# Patient Record
Sex: Male | Born: 1985 | Race: Black or African American | Hispanic: No | Marital: Single | Smoking: Former smoker
Health system: Southern US, Community
[De-identification: ages and names within clinical notes are randomized; demographics above are authoritative.]

## PROBLEM LIST (undated history)

## (undated) DIAGNOSIS — M109 Gout, unspecified: Secondary | ICD-10-CM

## (undated) DIAGNOSIS — I509 Heart failure, unspecified: Secondary | ICD-10-CM

## (undated) DIAGNOSIS — Z9289 Personal history of other medical treatment: Secondary | ICD-10-CM

## (undated) HISTORY — PX: TONSILLECTOMY: SUR1361

## (undated) HISTORY — DX: Heart failure, unspecified: I50.9

---

## 2005-10-08 ENCOUNTER — Other Ambulatory Visit: Payer: Self-pay

## 2005-10-08 ENCOUNTER — Emergency Department: Payer: Self-pay | Admitting: Unknown Physician Specialty

## 2010-12-09 ENCOUNTER — Emergency Department: Payer: Self-pay | Admitting: Emergency Medicine

## 2012-07-30 ENCOUNTER — Emergency Department: Payer: Self-pay | Admitting: Emergency Medicine

## 2012-12-19 ENCOUNTER — Emergency Department: Payer: Self-pay | Admitting: Emergency Medicine

## 2015-04-14 ENCOUNTER — Encounter: Payer: Self-pay | Admitting: Emergency Medicine

## 2015-04-14 ENCOUNTER — Emergency Department: Payer: BLUE CROSS/BLUE SHIELD

## 2015-04-14 ENCOUNTER — Inpatient Hospital Stay
Admission: EM | Admit: 2015-04-14 | Discharge: 2015-04-17 | DRG: 189 | Disposition: A | Discharge status: Home / Self Care | Payer: BLUE CROSS/BLUE SHIELD | Attending: Internal Medicine | Admitting: Internal Medicine

## 2015-04-14 DIAGNOSIS — R0602 Shortness of breath: Secondary | ICD-10-CM

## 2015-04-14 DIAGNOSIS — I509 Heart failure, unspecified: Secondary | ICD-10-CM

## 2015-04-14 DIAGNOSIS — M7989 Other specified soft tissue disorders: Secondary | ICD-10-CM | POA: Diagnosis present

## 2015-04-14 DIAGNOSIS — J9601 Acute respiratory failure with hypoxia: Secondary | ICD-10-CM | POA: Diagnosis present

## 2015-04-14 DIAGNOSIS — F17210 Nicotine dependence, cigarettes, uncomplicated: Secondary | ICD-10-CM | POA: Diagnosis present

## 2015-04-14 DIAGNOSIS — Z6841 Body Mass Index (BMI) 40.0 and over, adult: Secondary | ICD-10-CM | POA: Diagnosis not present

## 2015-04-14 DIAGNOSIS — M1000 Idiopathic gout, unspecified site: Secondary | ICD-10-CM | POA: Diagnosis present

## 2015-04-14 DIAGNOSIS — G4733 Obstructive sleep apnea (adult) (pediatric): Secondary | ICD-10-CM | POA: Diagnosis present

## 2015-04-14 DIAGNOSIS — R0902 Hypoxemia: Secondary | ICD-10-CM | POA: Diagnosis present

## 2015-04-14 DIAGNOSIS — G4730 Sleep apnea, unspecified: Secondary | ICD-10-CM | POA: Diagnosis present

## 2015-04-14 DIAGNOSIS — I5030 Unspecified diastolic (congestive) heart failure: Secondary | ICD-10-CM | POA: Diagnosis present

## 2015-04-14 DIAGNOSIS — Z791 Long term (current) use of non-steroidal anti-inflammatories (NSAID): Secondary | ICD-10-CM | POA: Diagnosis not present

## 2015-04-14 DIAGNOSIS — M25572 Pain in left ankle and joints of left foot: Secondary | ICD-10-CM | POA: Diagnosis present

## 2015-04-14 DIAGNOSIS — I5021 Acute systolic (congestive) heart failure: Secondary | ICD-10-CM | POA: Diagnosis not present

## 2015-04-14 HISTORY — DX: Personal history of other medical treatment: Z92.89

## 2015-04-14 HISTORY — DX: Morbid (severe) obesity due to excess calories: E66.01

## 2015-04-14 HISTORY — DX: Gout, unspecified: M10.9

## 2015-04-14 LAB — BLOOD GAS, ARTERIAL
ALLENS TEST (PASS/FAIL): POSITIVE — AB
Acid-Base Excess: 8.3 mmol/L — ABNORMAL HIGH (ref 0.0–3.0)
Bicarbonate: 36.9 mEq/L — ABNORMAL HIGH (ref 21.0–28.0)
FIO2: 0.36 %
O2 SAT: 93.3 %
PCO2 ART: 70 mmHg — AB (ref 32.0–48.0)
PH ART: 7.33 — AB (ref 7.350–7.450)
PO2 ART: 73 mmHg — AB (ref 83.0–108.0)
Patient temperature: 37

## 2015-04-14 LAB — FIBRIN DERIVATIVES D-DIMER (ARMC ONLY): FIBRIN DERIVATIVES D-DIMER (ARMC): 1142.03 — AB (ref 0–499)

## 2015-04-14 LAB — CBC
HCT: 44.1 % (ref 40.0–52.0)
HEMOGLOBIN: 13.9 g/dL (ref 13.0–18.0)
MCH: 24.8 pg — ABNORMAL LOW (ref 26.0–34.0)
MCHC: 31.5 g/dL — ABNORMAL LOW (ref 32.0–36.0)
MCV: 78.6 fL — ABNORMAL LOW (ref 80.0–100.0)
PLATELETS: 210 10*3/uL (ref 150–440)
RBC: 5.6 MIL/uL (ref 4.40–5.90)
RDW: 17.7 % — ABNORMAL HIGH (ref 11.5–14.5)
WBC: 10.7 10*3/uL — ABNORMAL HIGH (ref 3.8–10.6)

## 2015-04-14 LAB — BASIC METABOLIC PANEL
Anion gap: 9 (ref 5–15)
BUN: 17 mg/dL (ref 6–20)
CALCIUM: 8.3 mg/dL — AB (ref 8.9–10.3)
CHLORIDE: 97 mmol/L — AB (ref 101–111)
CO2: 35 mmol/L — ABNORMAL HIGH (ref 22–32)
CREATININE: 0.98 mg/dL (ref 0.61–1.24)
GFR calc Af Amer: >60 mL/min (ref >60)
GLUCOSE: 75 mg/dL (ref 65–99)
POTASSIUM: 4 mmol/L (ref 3.5–5.1)
Sodium: 141 mmol/L (ref 135–145)

## 2015-04-14 LAB — TROPONIN I: TROPONIN I: 0.03 ng/mL (ref <0.031)

## 2015-04-14 MED ORDER — FUROSEMIDE 10 MG/ML IJ SOLN
20.0000 mg | Freq: Two times a day (BID) | INTRAMUSCULAR | Status: DC
Start: 2015-04-14 — End: 2015-04-16
  Administered 2015-04-15 – 2015-04-16 (×3): 20 mg via INTRAVENOUS
  Filled 2015-04-14 (×3): qty 2

## 2015-04-14 MED ORDER — DOCUSATE SODIUM 100 MG PO CAPS: 100.0000 mg | ORAL_CAPSULE | Freq: Two times a day (BID) | ORAL | Status: DC | PRN

## 2015-04-14 MED ORDER — PREDNISONE 20 MG PO TABS
50.0000 mg | ORAL_TABLET | Freq: Every day | ORAL | Status: DC
  Administered 2015-04-15 – 2015-04-17 (×3): 50 mg via ORAL
  Filled 2015-04-14 (×3): qty 2

## 2015-04-14 MED ORDER — SODIUM CHLORIDE 0.9 % IJ SOLN
3.0000 mL | Freq: Two times a day (BID) | INTRAMUSCULAR | Status: DC
  Administered 2015-04-14 – 2015-04-16 (×5): 3 mL via INTRAVENOUS

## 2015-04-14 MED ORDER — FUROSEMIDE 10 MG/ML IJ SOLN
20.0000 mg | Freq: Once | INTRAMUSCULAR | Status: AC
  Administered 2015-04-14: 20 mg via INTRAVENOUS

## 2015-04-14 MED ORDER — SENNA 8.6 MG PO TABS: 1.0000 | ORAL_TABLET | Freq: Every day | ORAL | Status: DC | PRN

## 2015-04-14 MED ORDER — ALBUTEROL SULFATE (2.5 MG/3ML) 0.083% IN NEBU: 2.5000 mg | INHALATION_SOLUTION | Freq: Once | RESPIRATORY_TRACT | Status: AC

## 2015-04-14 MED ORDER — IPRATROPIUM-ALBUTEROL 0.5-2.5 (3) MG/3ML IN SOLN
RESPIRATORY_TRACT | Status: AC
  Administered 2015-04-14: 3 mL
  Filled 2015-04-14: qty 3

## 2015-04-14 MED ORDER — ALBUTEROL SULFATE (2.5 MG/3ML) 0.083% IN NEBU: 2.5000 mg | INHALATION_SOLUTION | RESPIRATORY_TRACT | Status: DC | PRN

## 2015-04-14 MED ORDER — FUROSEMIDE 10 MG/ML IJ SOLN
INTRAMUSCULAR | Status: AC
  Administered 2015-04-14: 20 mg via INTRAVENOUS
  Filled 2015-04-14: qty 4

## 2015-04-14 MED ORDER — ACETAMINOPHEN 325 MG PO TABS: 650.0000 mg | ORAL_TABLET | Freq: Four times a day (QID) | ORAL | Status: DC | PRN

## 2015-04-14 MED ORDER — HEPARIN SODIUM (PORCINE) 5000 UNIT/ML IJ SOLN
5000.0000 [IU] | Freq: Three times a day (TID) | INTRAMUSCULAR | Status: DC
  Administered 2015-04-14 – 2015-04-16 (×5): 5000 [IU] via SUBCUTANEOUS
  Filled 2015-04-14 (×5): qty 1

## 2015-04-14 NOTE — ED Provider Notes (Signed)
[facility] Emergency Department Provider Note  ____________________________________________  Time seen: Approximately 1:10 PM  I have reviewed the triage vital signs and the nursing notes.   HISTORY  Chief Complaint Leg Swelling    HPI Theodore Reid is a 29 y.o. male with a history of morbid obesity and gout who presents today with several months of worsening shortness of breath. Patient denies any chest pain. Denies any shortness of breath at rest. Says that his shortness of breath only occurs when he is up and walking. No history of DVT. Does think he gained weight over the past several months, but does not weigh self. Says that he does wheeze intermittently. No pain with deep breathing. Went to urgent care this morning for evaluation of left ankle pain which she says feels like his gout. He has had gout in his left ankle in the past. The ankle pain only started today and only hurts when ambulating. It is the left medial ankle. He does report swelling of the bilateral legs over the past year. No recent increase in swelling of the legs bilaterally. Denies any recent fever. The pain in the left ankle is sharp when bearing weight but not painful at all while laying.He is not on home O2.   Past Medical History  Diagnosis Date  . Obese     There are no active problems to display for this patient.   History reviewed. No pertinent past surgical history.  Current Outpatient Rx  Name  Route  Sig  Dispense  Refill  . acetaminophen (TYLENOL) 325 MG tablet   Oral   Take 650 mg by mouth every 6 (six) hours as needed for headache.         . ibuprofen (ADVIL,MOTRIN) 200 MG tablet   Oral   Take 200 mg by mouth every 6 (six) hours as needed for headache or mild pain.           Allergies Review of patient's allergies indicates not on file.  No family history on file.  Social History History  Substance Use Topics  . Smoking status: Former Games developermoker  .  Smokeless tobacco: Not on file  . Alcohol Use: No    Review of Systems Constitutional: No fever/chills Eyes: No visual changes. ENT: No sore throat. Cardiovascular: Denies chest pain. Respiratory: Shortness of breath.  Gastrointestinal: No abdominal pain.  No nausea, no vomiting.  No diarrhea.  No constipation. Genitourinary: Negative for dysuria. Musculoskeletal: Negative for back pain. Skin: Negative for rash. Neurological: Negative for headaches, focal weakness or numbness.  10-point ROS otherwise negative.  ____________________________________________   PHYSICAL EXAM:  VITAL SIGNS: ED Triage Vitals  Enc Vitals Group     BP 04/14/15 1223 163/99 mmHg     Pulse Rate 04/14/15 1223 86     Resp 04/14/15 1223 18     Temp 04/14/15 1223 97.7 F (36.5 C)     Temp src --      SpO2 04/14/15 1223 84 %     Weight 04/14/15 1233 575 lb (260.818 kg)     Height 04/14/15 1223 5\' 11"  (1.803 m)     Head Cir --      Peak Flow --      Pain Score 04/14/15 1234 4     Pain Loc --      Pain Edu? --      Excl. in GC? --     Constitutional: Alert and oriented. Morbidly obese. Wearing nasal cannula O2.  Eyes: Conjunctivae are normal. PERRL. EOMI. Head: Atraumatic. Nose: No congestion/rhinnorhea. Mouth/Throat: Mucous membranes are moist.  Oropharynx non-erythematous. Neck: No stridor.   Cardiovascular: Normal rate, regular rhythm. Grossly normal heart sounds.  Good peripheral circulation. Respiratory: Normal respiratory effort.  No retractions. Lungs CTAB. Gastrointestinal: Soft and nontender. No distention. No abdominal bruits. No CVA tenderness. Musculoskeletal: Bilateral lower extremity edema with hyperkeratinized skin likely consistent with chronic stasis changes.  No joint effusions. No tenderness over the left medial ankle. There is no redness, erythema or warmth. Neurologic:  Normal speech and language. No gross focal neurologic deficits are appreciated. Speech is normal. No gait  instability. Skin:  Skin is warm, dry and intact. No rash noted. Psychiatric: Mood and affect are normal. Speech and behavior are normal.  ____________________________________________   LABS (all labs ordered are listed, but only abnormal results are displayed)  Labs Reviewed  CBC - Abnormal; Notable for the following:    WBC 10.7 (*)    MCV 78.6 (*)    MCH 24.8 (*)    MCHC 31.5 (*)    RDW 17.7 (*)    All other components within normal limits  BASIC METABOLIC PANEL - Abnormal; Notable for the following:    Chloride 97 (*)    CO2 35 (*)    Calcium 8.3 (*)    All other components within normal limits  FIBRIN DERIVATIVES D-DIMER [facility]) - Abnormal; Notable for the following:    Fibrin derivatives D-dimer [facility]) 1142.03 (*)    All other components within normal limits  TROPONIN I   ____________________________________________  EKG  ED ECG REPORT   Date: 04/14/2015  EKG Time: 1231  Rate: 85  Rhythm: normal EKG, normal sinus rhythm, unchanged from previous tracings, normal sinus rhythm  Axis: Indeterminate axis  Intervals:Prolonged QT  ST&T Change: No ST elevations or depressions. No abnormal T-wave inversions. Pulmonary disease pattern read by EKG machine. No S1 every 3 T3 pattern because there is no Q wave in lead 3.  ____________________________________________  RADIOLOGY  Chest x-ray with cardiomegaly and pulmonary vascular congestion. No acute bony pathology on ankle x-ray. Soft tissue swelling noted. ____________________________________________    ____________________________________________   INITIAL IMPRESSION / ASSESSMENT AND PLAN / ED COURSE  Pertinent labs & imaging results that were available during my care of the patient were reviewed by me and considered in my medical decision making (see chart for details).  Patient with risk factors for pickwickian syndrome as well as PE. Patient is not hypotensive or tachycardic and does not have any pleuritic  chest pain. Less likely PE. However, the patient does not have wheezing. We'll try with nebulizer treatment. Will await d-dimer for further evaluation of clot.   ----------------------------------------- 3:25 PM on 04/14/2015 -----------------------------------------  Patient's symptoms likely obesity hypoventilation (has elevated bicarbonate on BMP.) Disease and heart failure. Elevated d-dimer. Patient pending VQ scan. Weight exceeds weight limit of our CAT scan here in the emergency department. Patient will be maintained on nasal cannula oxygen. Does not wear CPAP at home. Will be admitted to the hospital. ____________________________________________   FINAL CLINICAL IMPRESSION(S) / ED DIAGNOSES  Hypoxia. Heart failure. Acute, initial visit.    Myrna Blazeravid Matthew Schaevitz, MD 04/14/15 [phone]

## 2015-04-14 NOTE — ED Notes (Signed)
Pt reports that he has had swelling and redness in both legs for the last year, but just recently become more SOB. He went to the Urgent care and they sent him here for evaluated. Pt is SOB and quit smoking yesterday. Pts sats in the 80's. Can speak in a few word sentences.

## 2015-04-14 NOTE — ED Notes (Signed)
Dr. Pershing ProudSchaevitz notified of CO2 of 70 from ABG

## 2015-04-14 NOTE — Progress Notes (Signed)
Patient admitted to unit. Oriented to room, call bell, and staff. Bed in lowest position. Fall safety plan reviewed. Moderate fall risk - bed alarm not indicated.  Full assessment to Epic. Will continue to monitor. Seen by respiratory therapist. O2 sats at 91% on 4L so patient can eat dinner, than to be checked by RT and placed on Bipap. No pain at this time.  Adella NissenBailey, Timofey Carandang G

## 2015-04-14 NOTE — ED Notes (Signed)
Patient awake and alert eating a small snack. Mother at bedside. Patient able to stay awake on his own. O2 at 4L.  Respiratory running ABG and will set up Bipap.

## 2015-04-14 NOTE — H&P (Signed)
[facility] [place] [facility]   PATIENT NAME: Theodore Reid    MR#:  [id number]  DATE OF BIRTH:  12/21/1985  DATE OF ADMISSION:  04/14/2015  PRIMARY CARE PHYSICIAN: No primary care provider on file.   REQUESTING/REFERRING PHYSICIAN: None  CHIEF COMPLAINT:   Chief Complaint  Patient presents with  . Leg Swelling    HISTORY OF PRESENT ILLNESS:  Theodore Reid  is a 29 y.o. male with a known history of obesity presents to the urgent care secondary to left ankle pain. 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 When he went to urgent care for possible gout attack, they have noted that patient is hypoxic saturations and 86% on room air. So the patient was sent over to the emergency room. No known history of sleep apnea or COPD. Patient is morbidly obese and likely has underlying sleep apnea. He has snoring episodes at nighttime, daytime sleepiness. Chest x-ray here revealed possible pulmonary vascular congestion. So patient is being admitted for hypoxia likely secondary to congestive heart failure and likely has underlying sleep apnea.  PAST MEDICAL HISTORY:   Past Medical History  Diagnosis Date  . Obese     PAST SURGICAL HISTORY:   Past Surgical History  Procedure Laterality Date  . Tonsillectomy      SOCIAL HISTORY:   History  Substance Use Topics  . Smoking status: Current Every Day Smoker -- 1.00 packs/day    Types: Cigarettes  . Smokeless tobacco: Not on file  . Alcohol Use: No    FAMILY HISTORY:   Family History  Problem Relation Age of Onset  . Diabetes Mother   . CAD Mother   . Hypertension Mother   . Diabetes Father   . Cancer Father     DRUG ALLERGIES:  Not on File No known drug allergies REVIEW OF SYSTEMS:   Review of Systems  Constitutional: Negative for fever, chills, weight loss and malaise/fatigue.  HENT: Negative for ear discharge, ear pain, hearing loss, nosebleeds and  tinnitus.   Eyes: Negative for blurred vision, double vision and photophobia.       Uses reading glasses  Respiratory: Positive for shortness of breath. Negative for cough, hemoptysis and wheezing.   Cardiovascular: Positive for leg swelling. Negative for chest pain, palpitations and orthopnea.  Gastrointestinal: Negative for heartburn, nausea, vomiting, abdominal pain, diarrhea, constipation and melena.  Genitourinary: Negative for dysuria, urgency, frequency and hematuria.  Musculoskeletal: Positive for joint pain. Negative for myalgias, back pain and neck pain.  Skin: Negative for rash.  Neurological: Negative for dizziness, tingling, tremors, sensory change, speech change, focal weakness and headaches.  Endo/Heme/Allergies: Does not bruise/bleed easily.  Psychiatric/Behavioral: Negative for depression.    MEDICATIONS AT HOME:   Prior to Admission medications   Medication Sig Start Date End Date Taking? Authorizing Provider  acetaminophen (TYLENOL) 325 MG tablet Take 650 mg by mouth every 6 (six) hours as needed for headache.   Yes Historical Provider, MD  ibuprofen (ADVIL,MOTRIN) 200 MG tablet Take 200 mg by mouth every 6 (six) hours as needed for headache or mild pain.   Yes Historical Provider, MD      VITAL SIGNS:  Blood pressure 161/69, pulse 90, temperature 97.7 F (36.5 C), resp. rate 29, height 5\' 10"  (1.778 m), weight 260.818 kg (575 lb), SpO2 93 %.  PHYSICAL EXAMINATION:   Physical Exam  GENERAL:  29 y.o.-year-old morbidly obese patient lying in the bed with no acute  distress.  EYES: Pupils equal, round, reactive to light and accommodation. No scleral icterus. Extraocular muscles intact.  HEENT: Head atraumatic, normocephalic. Oropharynx and nasopharynx clear.  NECK:  Supple, no jugular venous distention. No thyroid enlargement, no tenderness.  LUNGS: Normal breath sounds bilaterally, decreased bibasilar breath sounds. no wheezing, rales,rhonchi or crepitation. No use  of accessory muscles of respiration.  CARDIOVASCULAR: S1, S2 normal. No murmurs, rubs, or gallops.  ABDOMEN: Soft, nontender, nondistended. Bowel sounds present. No organomegaly or mass.  EXTREMITIES: 2+ pedal edema, cyanosis, or clubbing.  NEUROLOGIC: Cranial nerves II through XII are intact. Muscle strength 5/5 in all extremities. Sensation intact. Gait not checked.  PSYCHIATRIC: The patient is alert and oriented x 3.  SKIN: No obvious rash, lesion, or ulcer.   LABORATORY PANEL:   CBC  Recent Labs Lab 04/14/15 1357  WBC 10.7*  HGB 13.9  HCT 44.1  PLT 210   ------------------------------------------------------------------------------------------------------------------  Chemistries   Recent Labs Lab 04/14/15 1357  NA 141  K 4.0  CL 97*  CO2 35*  GLUCOSE 75  BUN 17  CREATININE 0.98  CALCIUM 8.3*   ------------------------------------------------------------------------------------------------------------------  Cardiac Enzymes  Recent Labs Lab 04/14/15 1357  TROPONINI 0.03   ------------------------------------------------------------------------------------------------------------------  RADIOLOGY:  Dg Chest 2 View  04/14/2015   CLINICAL DATA:  29 year old male with intermittent shortness of breath  EXAM: CHEST  2 VIEW  COMPARISON:  Prior chest x-ray 12/19/2012  FINDINGS: Borderline cardiomegaly with pulmonary vascular congestion but no overt edema. No pleural effusion or pneumothorax. Radiographs are limited by body habitus. No acute osseous abnormality.  IMPRESSION: 1. Interval development of cardiomegaly and pulmonary vascular congestion bordering on mild interstitial edema. 2. Radiographs are slightly limited by patient body habitus.   Electronically Signed   By: Malachy MoanHeath  McCullough M.D.   On: 04/14/2015 14:32   Dg Ankle Complete Left  04/14/2015   CLINICAL DATA:  Left ankle pain  EXAM: LEFT ANKLE COMPLETE - 3+ VIEW  COMPARISON:  None.  FINDINGS: No acute  fracture. No dislocation. Minimal spurring at the posterior calcaneus. Diffuse soft tissue swelling about the ankle joint.  IMPRESSION: No acute bony pathology. Soft tissue swelling about the ankle is noted.   Electronically Signed   By: Jolaine ClickArthur  Hoss M.D.   On: 04/14/2015 14:33    EKG:  No orders found for this or any previous visit.  IMPRESSION AND PLAN:   Theodore Reid  is a 29 y.o. male with a known history of obesity presents to the urgent care secondary to left ankle pain and hypoxia.  #1 acute hypoxic respiratory failure-likely second to pulmonary vascular congestion. Likely has right heart strain from sleep apnea. We'll admit continue oxygen support. Currently on 3 L oxygen. IV Lasix twice a day. Echocardiogram. Try CPAP here at bedtime. Continue to wean oxygen as tolerated. D-dimer is elevated. Cannot undergo a CT chest due to his weight. A VQ scan has been ordered.  #2 acute gout of left ankle-x-rays negative. Started on prednisone x 5 days.  #3 DVT prophylaxis-subcutaneous heparin.    All the records are reviewed and case discussed with ED provider. Management plans discussed with the patient, family and they are in agreement.  CODE STATUS: Full code  TOTAL TIME TAKING CARE OF THIS PATIENT: 50 minutes.    Enid BaasKALISETTI,Syanna Remmert M.D on 04/14/2015 at 3:42 PM  Between 7am to 6pm - Pager - [phone]  After 6pm go to [email or web address] - password [email or web address] [facility]  [facility]  Office  [phone]  CC:  Primary care physician; No primary care provider on file.

## 2015-04-14 NOTE — ED Notes (Signed)
Patient came from [facility]. Was given IM shot of Toradol 60mg  and prescriptions for Indomethacin and Norco also.  Did not fill either one since they came straight from urgent care. C/o leg pain on the left side near ankle/foot.

## 2015-04-14 NOTE — ED Notes (Signed)
Patient in xray still.

## 2015-04-14 NOTE — ED Notes (Signed)
Notified that patient's sats on 4L were 48%, patient asleep on stretcher and somewhat more difficult than normal to arouse.  Patient finally able to arouse, oxygen sats coming up as patient awakens into the 80s.  Dr. Pershing ProudSchaevitz aware as well as attending, Bipap ordered and ABG ordered.  Respiratory notified.

## 2015-04-14 NOTE — ED Notes (Signed)
Dr. Pershing ProudSchaevitz notified of patient not able to do lung scan here due to his size.

## 2015-04-14 NOTE — ED Notes (Signed)
Patient oxygen sats 74% on 4L, Patient is asleep and snoring and has obvious periods of apnea that are observed by myself and the patient's mother.  Patient is easily arousable. Dr. Pershing ProudSchaevitz notified of results.

## 2015-04-14 NOTE — ED Notes (Signed)
Patient transported to X-Ray 

## 2015-04-15 ENCOUNTER — Inpatient Hospital Stay (HOSPITAL_COMMUNITY)
Admit: 2015-04-15 | Discharge: 2015-04-15 | Disposition: A | Discharge status: Home / Self Care | Payer: BLUE CROSS/BLUE SHIELD | Attending: Internal Medicine | Admitting: Internal Medicine

## 2015-04-15 DIAGNOSIS — I5021 Acute systolic (congestive) heart failure: Secondary | ICD-10-CM

## 2015-04-15 LAB — BASIC METABOLIC PANEL
ANION GAP: 8 (ref 5–15)
BUN: 19 mg/dL (ref 6–20)
CO2: 33 mmol/L — ABNORMAL HIGH (ref 22–32)
CREATININE: 0.91 mg/dL (ref 0.61–1.24)
Calcium: 8.6 mg/dL — ABNORMAL LOW (ref 8.9–10.3)
Chloride: 99 mmol/L — ABNORMAL LOW (ref 101–111)
GFR calc Af Amer: >60 mL/min (ref >60)
GFR calc non Af Amer: >60 mL/min (ref >60)
GLUCOSE: 108 mg/dL — AB (ref 65–99)
Potassium: 4.5 mmol/L (ref 3.5–5.1)
Sodium: 140 mmol/L (ref 135–145)

## 2015-04-15 LAB — CBC
HEMATOCRIT: 46.9 % (ref 40.0–52.0)
HEMOGLOBIN: 14.7 g/dL (ref 13.0–18.0)
MCH: 25 pg — ABNORMAL LOW (ref 26.0–34.0)
MCHC: 31.4 g/dL — AB (ref 32.0–36.0)
MCV: 79.7 fL — AB (ref 80.0–100.0)
Platelets: 226 10*3/uL (ref 150–440)
RBC: 5.89 MIL/uL (ref 4.40–5.90)
RDW: 18 % — ABNORMAL HIGH (ref 11.5–14.5)
WBC: 8.9 10*3/uL (ref 3.8–10.6)

## 2015-04-15 NOTE — Progress Notes (Signed)
Dr. Amado CoeGouru spoke to family on phone for update, per family/patient request. MD to order pulmonary consult. No patient complaints at this time.  Adella NissenBailey, Priya Matsen G

## 2015-04-15 NOTE — Progress Notes (Signed)
PT Cancellation Note  Patient Details Name: Theodore NewtonMichael J Reid MRN: [id number] DOB: 11-24-1986   Cancelled Treatment:    Reason Eval/Treat Not Completed: Other (comment). Chart reviewed, RN consulted. Holding pt treatment at this time due to elevated D-Dimer and ordered VQ scan that was not able to take place. PT would like to speak to physician to get clearance to work with PT in absence of VQ,or if evaluation should be held until 48hrs s/p commencement of anticoagulation therapy. RN is in accordance. Will attempt at later date/time.     Buccola,Allan C 04/15/2015, 2:01 PM  Rosamaria LintsAllan C Buccola, PT, DPT, BM

## 2015-04-15 NOTE — Progress Notes (Signed)
Patient was on acute 4L of supplemental O2 overnight. He denied any pain and was NSR on the monitor. Patient was educated about subQ heparin.

## 2015-04-15 NOTE — Progress Notes (Signed)
[facility] [place] [facility]   PATIENT NAME: Theodore JacksMichael Reid    MR#:  [id number]  DATE OF BIRTH:  [birth year]  SUBJECTIVE:  CHIEF COMPLAINT:  Shortness of breath Today patient is lethargic but opens eyes to verbal commands and falling asleep again still on BiPAP Reports that his shortness of breath is better  REVIEW OF SYSTEMS:  CONSTITUTIONAL: No fever, fatigue or weakness.  EYES: No blurred or double vision.  EARS, NOSE, AND THROAT: No tinnitus or ear pain.  RESPIRATORY: No cough, shortness of breath with exertion, no wheezing or hemoptysis.  CARDIOVASCULAR: No chest pain, orthopnea, edema.  GASTROINTESTINAL: No nausea, vomiting, diarrhea or abdominal pain.  GENITOURINARY: No dysuria, hematuria.  ENDOCRINE: No polyuria, nocturia,  HEMATOLOGY: No anemia, easy bruising or bleeding SKIN: No rash or lesion. MUSCULOSKELETAL: No joint pain or arthritis.   NEUROLOGIC: No tingling, numbness, weakness.  PSYCHIATRY: No anxiety or depression.   DRUG ALLERGIES:  Not on File  VITALS:  Blood pressure 132/62, pulse 72, temperature 98.1 F (36.7 C), temperature source Axillary, resp. rate 18, height 5\' 10"  (1.778 m), weight 201.352 kg (443 lb 14.4 oz), SpO2 96 %.  PHYSICAL EXAMINATION:  GENERAL:  29 y.o.-year-old patient lying in the bed with no acute distress. Morbidly obese EYES: Pupils equal, round, reactive to light and accommodation. No scleral icterus. Extraocular muscles intact.  HEENT: Head atraumatic, normocephalic. Oropharynx and nasopharynx clear.  NECK:  Supple, no jugular venous distention. No thyroid enlargement, no tenderness.  LUNGS: Normal breath sounds bilaterally, no wheezing, some rales,rhonchi or crepitation. No use of accessory muscles of respiration.  CARDIOVASCULAR: S1, S2 normal. No murmurs, rubs, or gallops.  ABDOMEN: Soft, nontender, nondistended. Bowel sounds present. No organomegaly or mass.  EXTREMITIES: No pedal edema, cyanosis,  or clubbing.  NEUROLOGIC: Cranial nerves II through XII are intact. Muscle strength 5/5 in all extremities. Sensation intact. Gait not checked.  PSYCHIATRIC: The patient is alert and oriented x 3.  SKIN: No obvious rash, lesion, or ulcer.    LABORATORY PANEL:   CBC  Recent Labs Lab 04/15/15 0429  WBC 8.9  HGB 14.7  HCT 46.9  PLT 226   ------------------------------------------------------------------------------------------------------------------  Chemistries   Recent Labs Lab 04/15/15 0429  NA 140  K 4.5  CL 99*  CO2 33*  GLUCOSE 108*  BUN 19  CREATININE 0.91  CALCIUM 8.6*   ------------------------------------------------------------------------------------------------------------------  Cardiac Enzymes  Recent Labs Lab 04/14/15 1357  TROPONINI 0.03   ------------------------------------------------------------------------------------------------------------------  RADIOLOGY:  Dg Chest 2 View  04/14/2015   CLINICAL DATA:  29 year old male with intermittent shortness of breath  EXAM: CHEST  2 VIEW  COMPARISON:  Prior chest x-ray 12/19/2012  FINDINGS: Borderline cardiomegaly with pulmonary vascular congestion but no overt edema. No pleural effusion or pneumothorax. Radiographs are limited by body habitus. No acute osseous abnormality.  IMPRESSION: 1. Interval development of cardiomegaly and pulmonary vascular congestion bordering on mild interstitial edema. 2. Radiographs are slightly limited by patient body habitus.   Electronically Signed   By: Malachy MoanHeath  McCullough M.D.   On: 04/14/2015 14:32   Dg Ankle Complete Left  04/14/2015   CLINICAL DATA:  Left ankle pain  EXAM: LEFT ANKLE COMPLETE - 3+ VIEW  COMPARISON:  None.  FINDINGS: No acute fracture. No dislocation. Minimal spurring at the posterior calcaneus. Diffuse soft tissue swelling about the ankle joint.  IMPRESSION: No acute bony pathology. Soft tissue swelling about the ankle is noted.   Electronically Signed    By: Merton BorderArthur  Hoss M.D.   On: 04/14/2015 14:33    EKG:   Orders placed or performed during the hospital encounter of 04/14/15  . EKG 12-Lead  . EKG 12-Lead    ASSESSMENT AND PLAN:   *1 acute hypoxic respiratory failure-likely second to pulmonary vascular congestion. Likely has right heart strain from sleep apnea . Weaned off BiPAP, Currently on 3 L oxygen. IV Lasix twice a day. Echocardiogram. Try CPAP here at bedtime. Continue to wean oxygen as tolerated. Pulmonology consult is placed, needs outpatient pulmonary function tests and sleep study D-dimer is elevated. Cannot undergo a CT chest due to his weight. Can't get VQ scan either because of his body habitus . Clinically better.  * acute gout of left ankle-x-rays negative. Started on prednisone x 5 days.  *Morbid obesity-patient will be benefited with bariatric surgery  * DVT prophylaxis-subcutaneous heparin     All the records are reviewed and case discussed with Care Management/Social Workerr. Management plans discussed with the patient, family and they are in agreement.  CODE STATUS: full  TOTAL TIME TAKING CARE OF THIS PATIENT: 35 minutes.   POSSIBLE D/C IN 2-3 DAYS, DEPENDING ON CLINICAL CONDITION.   [name], Kirstan Fentress M.D on 04/15/2015 at 2:32 PM  Between 7am to 6pm - Pager - [phone] After 6pm go to [email or web address] - password [email or web address] [facility]  [facility]  Office  [phone]  CC: Primary care physician; No primary care provider on file.

## 2015-04-16 ENCOUNTER — Encounter: Payer: Self-pay | Admitting: Physician Assistant

## 2015-04-16 ENCOUNTER — Inpatient Hospital Stay: Payer: BLUE CROSS/BLUE SHIELD

## 2015-04-16 LAB — BASIC METABOLIC PANEL
Anion gap: 7 (ref 5–15)
BUN: 19 mg/dL (ref 6–20)
CO2: 36 mmol/L — ABNORMAL HIGH (ref 22–32)
CREATININE: 0.9 mg/dL (ref 0.61–1.24)
Calcium: 8.4 mg/dL — ABNORMAL LOW (ref 8.9–10.3)
Chloride: 96 mmol/L — ABNORMAL LOW (ref 101–111)
GFR calc Af Amer: >60 mL/min (ref >60)
Glucose, Bld: 98 mg/dL (ref 65–99)
POTASSIUM: 3.9 mmol/L (ref 3.5–5.1)
Sodium: 139 mmol/L (ref 135–145)

## 2015-04-16 LAB — CBC
HCT: 47.6 % (ref 40.0–52.0)
Hemoglobin: 14.8 g/dL (ref 13.0–18.0)
MCH: 24.7 pg — AB (ref 26.0–34.0)
MCHC: 31 g/dL — ABNORMAL LOW (ref 32.0–36.0)
MCV: 79.6 fL — ABNORMAL LOW (ref 80.0–100.0)
Platelets: 224 10*3/uL (ref 150–440)
RBC: 5.98 MIL/uL — ABNORMAL HIGH (ref 4.40–5.90)
RDW: 17.7 % — AB (ref 11.5–14.5)
WBC: 11.3 10*3/uL — ABNORMAL HIGH (ref 3.8–10.6)

## 2015-04-16 LAB — URIC ACID: Uric Acid, Serum: 11.4 mg/dL — ABNORMAL HIGH (ref 4.4–7.6)

## 2015-04-16 MED ORDER — FUROSEMIDE 20 MG PO TABS
20.0000 mg | ORAL_TABLET | Freq: Two times a day (BID) | ORAL | Status: DC
  Administered 2015-04-16 – 2015-04-17 (×2): 20 mg via ORAL
  Filled 2015-04-16 (×2): qty 1

## 2015-04-16 MED ORDER — TECHNETIUM TO 99M ALBUMIN AGGREGATED
5.9480 | Freq: Once | INTRAVENOUS | Status: AC | PRN
  Administered 2015-04-16: 5.948 via INTRAVENOUS

## 2015-04-16 MED ORDER — RIVAROXABAN 15 MG PO TABS
15.0000 mg | ORAL_TABLET | Freq: Two times a day (BID) | ORAL | Status: DC
  Administered 2015-04-16: 15 mg via ORAL
  Filled 2015-04-16 (×2): qty 1

## 2015-04-16 MED ORDER — TECHNETIUM TC 99M DIETHYLENETRIAME-PENTAACETIC ACID
51.4040 | Freq: Once | INTRAVENOUS | Status: AC | PRN
  Administered 2015-04-16: 51.404 via INTRAVENOUS

## 2015-04-16 NOTE — Care Management (Signed)
Provided patient with Xarelto 30 day and  co pay asssitance coupons.  Patient was off the unit for testing.  Placed coupons on patient over bed table with the literature for the medication.    Spoke with attending and it is anticipated that pulmonologist may coordinate outpatient sleep study for patient.

## 2015-04-16 NOTE — Evaluation (Signed)
Physical Therapy Evaluation Patient Details Name: Theodore Reid: [id number] DOB: 08-Jun-1986 Today's Date: 04/16/2015   History of Present Illness  Theodore Reid is a 29 y.o. male with a known history of obesity presents to the urgent care secondary to left ankle pain. 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 When he went to urgent care for possible gout attack, they have noted that patient is hypoxic saturations and 86% on room air. So the patient was sent over to the emergency room. No known history of sleep apnea or COPD. Patient is morbidly obese and likely has underlying sleep apnea. He has snoring episodes at nighttime, daytime sleepiness. Chest x-ray here revealed possible pulmonary vascular congestion. So patient is being admitted for hypoxia likely secondary to congestive heart failure and likely has underlying sleep apnea. Pt had elevated d-dimer on 5/18, at which point a VQ-scan and CT scan were ordered, but not completed secondary to habitus. Pt has been ambulating the room ad-lib without addiional complaints.   Clinical Impression  Pt presents with mild impairment of oxygen perfusion, but is otherwise at baseline for functional mobility. Pt denies history of falls in past 6 months. Pt reports no limitations to activity. Pt performed 5x sit-to/from-stand without UE support in 25 seconds with HR and SaO2 stable. Pt has been ambulating about the room ad lib. Pt given education on use of O2, weight loss, and management of LE swelling/ utility of ankle pumps. Pt presents with no impairments at this time that warrant skilled intervention. No further PT at this time.      Follow Up Recommendations No PT follow up    Equipment Recommendations  None recommended by PT    Recommendations for Other Services       Precautions / Restrictions Precautions Precautions: None Restrictions Weight Bearing Restrictions: No      Mobility  Bed  Mobility Overal bed mobility:  (Pt received in chair; not assessed this session. )                Transfers Overall transfer level: Independent Equipment used: None             General transfer comment: Pt performed 5x STS in 25 seconds, on 2L via [place] with HR stable:80 and SaO2 stable:90%.   Ambulation/Gait Ambulation/Gait assistance:  (Not assessed at this time. )              Stairs            Wheelchair Mobility    Modified Rankin (Stroke Patients Only)       Balance Overall balance assessment: Independent                                           Pertinent Vitals/Pain Pain Assessment: No/denies pain    Home Living Family/patient expects to be discharged to:: Private residence Living Arrangements: Parent Available Help at Discharge: Family Type of Home: House Home Access: Stairs to enter   Secretary/administratorntrance Stairs-Number of Steps: 3 Home Layout: One level Home Equipment: None Additional Comments: No previous trouble with entry stairs at home.     Prior Function Level of Independence: Independent               Hand Dominance        Extremity/Trunk Assessment   Upper Extremity Assessment: Overall WFL for tasks  assessed           Lower Extremity Assessment: LLE deficits/detail;RLE deficits/detail RLE Deficits / Details: +1 pitting edema LLE Deficits / Details: +1 pitting edema, intermittent numbness     Communication   Communication: No difficulties  Cognition Arousal/Alertness: Awake/alert Behavior During Therapy: WFL for tasks assessed/performed Overall Cognitive Status: Within Functional Limits for tasks assessed                      General Comments      Exercises        Assessment/Plan    PT Assessment Patent does not need any further PT services  PT Diagnosis     PT Problem List    PT Treatment Interventions     PT Goals (Current goals can be found in the Care Plan section)       Frequency     Barriers to discharge        Co-evaluation               End of Session   Activity Tolerance: Patient tolerated treatment well Patient left: in chair;with call bell/phone within reach (Oxygen donned ) Nurse Communication: Other (comment) (Acquisition of bariatric chair; low SaO2 on RA upon arrival. )         Time: 1191-47821356-1415 PT Time Calculation (min) (ACUTE ONLY): 19 min   Charges:   PT Evaluation $Initial PT Evaluation Tier I: 1 Procedure     PT G Codes:        Buccola,Allan C 04/16/2015, 2:48 PM  Rosamaria LintsAllan C Buccola, PT, DPT

## 2015-04-16 NOTE — Plan of Care (Signed)
Problem: Phase I Progression Outcomes Goal: Other Phase II Outcomes/Goals Patient has no acute event overnight. He remained hemodynamically stable with increase activity tolerance. He denied pain or any discomfort. Pulmonology consult was initiated during day shift for patient. Patient remained NSR with VS WDL for patient.

## 2015-04-16 NOTE — Progress Notes (Signed)
RN offered patient option of possibly requesting larger recliner if it would be more comfortable. Patient declined, stating his chair was fine.  Theodore Reid, Kristin Lamagna G

## 2015-04-16 NOTE — Consult Note (Signed)
Cardiology Consultation Note  Patient ID: Theodore Reid, MRN: [id number], DOB/AGE: [birth year] 29 y.o. Admit date: 04/14/2015   Date of Consult: 04/16/2015 Primary Physician: No primary care provider on file. Primary Cardiologist: New to [facility]  Chief Complaint: Left lower extremity swelling Reason for Consult: Acute respiratory failure requiring BiPAP  HPI: 29 y.o. male with h/o morbid obesity and gout who presented to outside urgent care on 5/18 for left lower extremity pain and swelling and was noted to be hypoxic with O2 sats of 86% on room air and was sent to [facility] ED for further evaluation. Cardiology is consulted today for further input in his SOB.   He has no previously known cardiac history and has never seen a cardiologist before. Over the past 2-3 months he has been experiencing increased SOB that has worsened over the past couple of weeks, occuring both at rest and with exertion. There has been no associated chest pain. His weight has been stable. He denies any increased lower extremity swelling. He notes an increased hyperpigmentation along the lower portion of his left lower extremity over this past week with associated pain. He has not been on any recent long sedentary trips. No recent vascular trauma. No known history of cancer in him. No known coagulabilities in his family.   He presented to outside urgent care thinking this lower extremity swelling and pain was gout, but was found to be hypoxic as above. He reports he rarely has a gout attack and cannot remember the last time he had a gout attack, possibly last year. He does not drink alcohol, rarely eats red meat or shellfish.   Upon his arrival to [facility] ED he was found to be hypoxic with O2 sats of 84% on room air and able to speak in a few word sentences. He was placed on 4L O2 with O2 sats at 74% with periods of apnea observed. Patient later became more difficult to arouse and was placed on BiPAP. Labs showed an elevated d-dimer of  1142, negative troponin, WBC 10.7-->8.9-->11.3, ABG pH 7.33, pO2 73. Uric acid has not been collected, though could be normal to mildly elevated in a acute gout flare. CXR showed interval development of cardiomegaly and pulmonary vascular congestion bordering on mild interstitial edema. CXR limited by body habitus. It was felt he had acute CHF, however echo showed normal EF 60-65%, no RWMA, LA normal in size, RV poorly visualized, PASP normal. Left ankle x ray negative. LE doppler pending. He exceeded weight limits for CTA chest and it was felt he was too large for a VQ scan. He was ultimately started on empiric Xarelto 15 mg bid on 5/20. He reports mild SOB upon me seeing him today. No chest pain.     Past Medical History  Diagnosis Date  . Morbid obesity   . Gout   . History of echocardiogram     a. a. echo 04/15/2015: EF 60-65%, no WMA, LA nl in size, RV poorly visualized, PASP nl, grossly nl study, challening study 2/2 body habitus       Most Recent Cardiac Studies: Echo 04/16/2015:  Study Conclusions  - Left ventricle: The cavity size was normal. There was mild concentric hypertrophy. Systolic function was normal. The estimated ejection fraction was in the range of 60% to 65%. Wall motion was normal; there were no regional wall motion abnormalities. - Left atrium: The atrium was normal in size. - Right ventricle: Poorly visualized. Systolic function was normal. - Pulmonary arteries: Systolic  pressure was within the normal range.  Impressions:  - Grossly a normal study. Challenging image quality secondary to body habitus   Surgical History:  Past Surgical History  Procedure Laterality Date  . Tonsillectomy       Home Meds: Prior to Admission medications   Medication Sig Start Date End Date Taking? Authorizing Provider  acetaminophen (TYLENOL) 325 MG tablet Take 650 mg by mouth every 6 (six) hours as needed for headache.   Yes Historical Provider, MD  ibuprofen  (ADVIL,MOTRIN) 200 MG tablet Take 200 mg by mouth every 6 (six) hours as needed for headache or mild pain.   Yes Historical Provider, MD    Inpatient Medications:  . furosemide  20 mg Oral BID  . predniSONE  50 mg Oral Q breakfast  . Rivaroxaban  15 mg Oral BID WC  . sodium chloride  3 mL Intravenous Q12H      Allergies: Not on File  History   Social History  . Marital Status: Single    Spouse Name: N/A  . Number of Children: N/A  . Years of Education: N/A   Occupational History  . Not on file.   Social History Main Topics  . Smoking status: Current Every Day Smoker -- 1.00 packs/day    Types: Cigarettes  . Smokeless tobacco: Not on file  . Alcohol Use: No  . Drug Use: No  . Sexual Activity: Not on file   Other Topics Concern  . Not on file   Social History Narrative   Lives at home with Mother.   Works in [employer] dept at [employer]     Family History  Problem Relation Age of Onset  . Diabetes Mother   . CAD Mother   . Hypertension Mother   . Diabetes Father   . Cancer Father     rectal     Review of Systems: Review of Systems  Constitutional: Positive for malaise/fatigue. Negative for fever, chills, weight loss and diaphoresis.  HENT: Negative for sore throat.   Eyes: Negative for photophobia, discharge and redness.  Respiratory: Positive for shortness of breath. Negative for cough, hemoptysis, sputum production and wheezing.   Cardiovascular: Positive for leg swelling. Negative for chest pain, palpitations, orthopnea, claudication and PND.  Gastrointestinal: Negative for heartburn, nausea, vomiting, abdominal pain, blood in stool and melena.  Genitourinary: Negative for dysuria.  Musculoskeletal: Negative for myalgias and falls.  Skin: Negative for itching and rash.  Neurological: Positive for weakness. Negative for dizziness, tingling, sensory change and focal weakness.  Psychiatric/Behavioral: Negative for depression and substance  abuse. The patient is not nervous/anxious.   All other systems were reviewed and negative.   Labs:  Recent Labs  04/14/15 1357  TROPONINI 0.03   Lab Results  Component Value Date   WBC 11.3* 04/16/2015   HGB 14.8 04/16/2015   HCT 47.6 04/16/2015   MCV 79.6* 04/16/2015   PLT 224 04/16/2015     Recent Labs Lab 04/16/15 0617  NA 139  K 3.9  CL 96*  CO2 36*  BUN 19  CREATININE 0.90  CALCIUM 8.4*  GLUCOSE 98   No results found for: CHOL, HDL, LDLCALC, TRIG D dimer from 04/14/2015: 1142.03 (0-499)  Radiology/Studies:  Dg Chest 2 View  04/14/2015   CLINICAL DATA:  29 year old male with intermittent shortness of breath  EXAM: CHEST  2 VIEW  COMPARISON:  Prior chest x-ray 12/19/2012  FINDINGS: Borderline cardiomegaly with pulmonary vascular congestion but no overt edema. No  pleural effusion or pneumothorax. Radiographs are limited by body habitus. No acute osseous abnormality.  IMPRESSION: 1. Interval development of cardiomegaly and pulmonary vascular congestion bordering on mild interstitial edema. 2. Radiographs are slightly limited by patient body habitus.   Electronically Signed   By: Malachy MoanHeath  McCullough M.D.   On: 04/14/2015 14:32   Dg Ankle Complete Left  04/14/2015   CLINICAL DATA:  Left ankle pain  EXAM: LEFT ANKLE COMPLETE - 3+ VIEW  COMPARISON:  None.  FINDINGS: No acute fracture. No dislocation. Minimal spurring at the posterior calcaneus. Diffuse soft tissue swelling about the ankle joint.  IMPRESSION: No acute bony pathology. Soft tissue swelling about the ankle is noted.   Electronically Signed   By: Jolaine ClickArthur  Hoss M.D.   On: 04/14/2015 14:33    EKG: NSR, 85 bpm, prolonged QT, pulmonary disease pattern, nonspecific st/t changes  Weights: Filed Weights   04/15/15 0504 04/16/15 0443 04/16/15 1429  Weight: 443 lb 14.4 oz (201.352 kg) 448 lb 4.8 oz (203.348 kg) 442 lb 14.4 oz (200.898 kg)     Physical Exam: Blood pressure 125/48, pulse 73, temperature 98.3 F (36.8  C), temperature source Oral, resp. rate 22, height 5\' 10"  (1.778 m), weight 442 lb 14.4 oz (200.898 kg), SpO2 84 %. Body mass index is 63.55 kg/(m^2). General: Well developed, well nourished, in no acute distress. Morbidly obese.  Head: Normocephalic, atraumatic, sclera non-icteric, no xanthomas, nares are without discharge.  Neck: Negative for carotid bruits. JVD not elevated. Lungs: Clear bilaterally to auscultation without wheezes, rales, or rhonchi. Breathing is unlabored. Heart: RRR with S1 S2. No murmurs, rubs, or gallops appreciated. Abdomen: Obese, soft, non-tender, non-distended with normoactive bowel sounds. No hepatomegaly. No rebound/guarding. No obvious abdominal masses. Msk:  Strength and tone appear normal for age. Extremities: No clubbing or cyanosis. Difficult to assess for cording given habitus.  Left lower extremity with hyperpigmentation along distal aspect. Neuro: Alert and oriented X 3. No facial asymmetry. No focal deficit. Moves all extremities spontaneously. Psych:  Responds to questions appropriately with a normal affect.    Assessment and Plan:  29 y.o. male with h/o morbid obesity and gout who presented to outside urgent care on 5/18 for left lower extremity pain and swelling and was noted to be hypoxic with O2 sats of 86% on room air and [facility] and was found to be in acute hypoxic respiratory failure requiring nasal cannula to BiPAP.  1. Acute hypoxic respiratory failure: -Requiring nasal cannula currently and BiPAP previously to maintain O2 sats -Echo showing normal LV function, normal right sided pressure making CHF unlikely  -Patient's d-dimer was significantly elevated upon admission at 1142.03 -Given his left lower extremity swelling, hyperpigmentation, and pain upon presentation DVT/PE is a strong possibility  -Upon calling nuclear medicine he can undergo a VQ scan to evaluate for PE at today's date as his weight is  currently less than 500 pounds (apparently this was not the case upon admission - I question how accurate that was given his current weight of 448 and admission weight of 575), this has been ordered -He has been placed on empiric Xarelto 15 mg bid as on 5/20 by IM, await the above scan for further comment  -He has been receiving IV Lasix 20 mg with a net of minus 2560 mL on 5/19, and was changed to po Lasix 20 mg bid on 5/20 with decreased urine output  -Repeat CXR ordered as part of VQ scan  protocol   2. Left lower extremity pain: -Add on uric acid, level may be mildly elevated to normal if he is indeed having a gout flare  -On prednisone  -Lower extremity doppler pending  3. Morbid obesity: -No known OSA/OHS -Would benefit from nutritionist/dietitian  4. Ongoing tobacco abuse: -Reports quitting 2 days ago -No known COPD     Signed, Eula Listen, PA-C Pager: [phone] 04/16/2015, 3:19 PM

## 2015-04-16 NOTE — Progress Notes (Addendum)
[facility] [place] at [facility]   PATIENT NAME: Theodore Reid    MR#:  [id number]  DATE OF BIRTH:  25-Jan-1986  SUBJECTIVE:  CHIEF COMPLAINT:  Shortness of breath Today patient is awake and alert, shortness of breath is significantly improved. Off BiPAP. Reports some calf tenderness  REVIEW OF SYSTEMS:  CONSTITUTIONAL: No fever, fatigue or weakness.  EYES: No blurred or double vision.  EARS, NOSE, AND THROAT: No tinnitus or ear pain.  RESPIRATORY: No cough, shortness of breath with exertion, no wheezing or hemoptysis.  CARDIOVASCULAR: No chest pain, orthopnea, edema.  GASTROINTESTINAL: No nausea, vomiting, diarrhea or abdominal pain.  GENITOURINARY: No dysuria, hematuria.  ENDOCRINE: No polyuria, nocturia,  HEMATOLOGY: No anemia, easy bruising or bleeding SKIN: No rash or lesion. MUSCULOSKELETAL: Reports calf pain No joint pain or arthritis.   NEUROLOGIC: No tingling, numbness, weakness.  PSYCHIATRY: No anxiety or depression.   DRUG ALLERGIES:  Not on File  VITALS:  Blood pressure 125/48, pulse 73, temperature 98.3 F (36.8 C), temperature source Oral, resp. rate 22, height 5\' 10"  (1.778 m), weight 203.348 kg (448 lb 4.8 oz), SpO2 91 %.  PHYSICAL EXAMINATION:  GENERAL:  29 y.o.-year-old patient lying in the bed with no acute distress. Morbidly obese EYES: Pupils equal, round, reactive to light and accommodation. No scleral icterus. Extraocular muscles intact.  HEENT: Head atraumatic, normocephalic. Oropharynx and nasopharynx clear.  NECK:  Supple, no jugular venous distention. No thyroid enlargement, no tenderness.  LUNGS: Normal breath sounds bilaterally, no wheezing, some rales,rhonchi or crepitation. No use of accessory muscles of respiration.  CARDIOVASCULAR: S1, S2 normal. No murmurs, rubs, or gallops.  ABDOMEN: Soft, nontender, nondistended. Bowel sounds present. No organomegaly or mass.  EXTREMITIES: No pedal edema, cyanosis, or clubbing.   NEUROLOGIC: Cranial nerves II through XII are intact. Muscle strength 5/5 in all extremities. Sensation intact. Gait not checked.  PSYCHIATRIC: The patient is alert and oriented x 3.  SKIN: No obvious rash, lesion, or ulcer.    LABORATORY PANEL:   CBC  Recent Labs Lab 04/16/15 0617  WBC 11.3*  HGB 14.8  HCT 47.6  PLT 224   ------------------------------------------------------------------------------------------------------------------  Chemistries   Recent Labs Lab 04/16/15 0617  NA 139  K 3.9  CL 96*  CO2 36*  GLUCOSE 98  BUN 19  CREATININE 0.90  CALCIUM 8.4*   ------------------------------------------------------------------------------------------------------------------  Cardiac Enzymes  Recent Labs Lab 04/14/15 1357  TROPONINI 0.03   ------------------------------------------------------------------------------------------------------------------  RADIOLOGY:  Dg Chest 2 View  04/14/2015   CLINICAL DATA:  29 year old male with intermittent shortness of breath  EXAM: CHEST  2 VIEW  COMPARISON:  Prior chest x-ray 12/19/2012  FINDINGS: Borderline cardiomegaly with pulmonary vascular congestion but no overt edema. No pleural effusion or pneumothorax. Radiographs are limited by body habitus. No acute osseous abnormality.  IMPRESSION: 1. Interval development of cardiomegaly and pulmonary vascular congestion bordering on mild interstitial edema. 2. Radiographs are slightly limited by patient body habitus.   Electronically Signed   By: Malachy MoanHeath  McCullough M.D.   On: 04/14/2015 14:32   Dg Ankle Complete Left  04/14/2015   CLINICAL DATA:  Left ankle pain  EXAM: LEFT ANKLE COMPLETE - 3+ VIEW  COMPARISON:  None.  FINDINGS: No acute fracture. No dislocation. Minimal spurring at the posterior calcaneus. Diffuse soft tissue swelling about the ankle joint.  IMPRESSION: No acute bony pathology. Soft tissue swelling about the ankle is noted.   Electronically Signed   By: Dahlia ClientArthur   Hoss M.D.  On: 04/14/2015 14:33    EKG:   Orders placed or performed during the hospital encounter of 04/14/15  . EKG 12-Lead  . EKG 12-Lead    ASSESSMENT AND PLAN:   *1 acute hypoxic respiratory failure-likely second to pulmonary vascular congestion. Likely has right heart strain from sleep apnea . Weaned off BiPAP, Currently on 3 L oxygen. IV Lasix will be changed to by mouth twice a day. Echocardiogram with normal ejection fraction at 60-65%. Try CPAP here at bedtime. Continue to wean oxygen as tolerated. Pulmonology consult is placed, discussed with Dr. Welton FlakesKhan, needs outpatient pulmonary function tests and sleep study. Consult is placed to cardiology-Dr. Lewie LoronGolan per patient's request   *D-dimer is elevated with the bilateral calf tenderness and morbidly obese body habitus. -Stat bilateral lower extending to venous Dopplers -Empiric treatment with Xarelto 15 mg by mouth twice a day -Consult case management regarding medication help with Xarelto -Pending pulmonology consult - Cannot undergo a CT chest due to his weight. Can't get VQ scan either because of his body habitus . Clinically better.  * acute gout of left ankle-x-rays negative. Clinically feeling better. on prednisone x 5 days.  *Morbid obesity-patient will be benefited with bariatric surgery  *Nicotine abuse-counseled patient to quit smoking. Recommended to use over-the-counter nicotine patch, he is agreeable  * DVT prophylaxis-discontinue subcutaneous heparin, as we are starting him on Xarelto     All the records are reviewed and case discussed with Care Management/Social Workerr. Management plans discussed with the patient, family and they are in agreement.  CODE STATUS: full  TOTAL TIME TAKING CARE OF THIS PATIENT: 35 minutes.   POSSIBLE D/C IN 2-3 DAYS, DEPENDING ON CLINICAL CONDITION.   [name], Otilia Kareem M.D on 04/16/2015 at 12:20 PM  Between 7am to 6pm - Pager - [phone] After 6pm go to [email or web address] -  password [email or web address] [facility]  [facility]  Office  [phone]  CC: Primary care physician; No primary care provider on file.

## 2015-04-16 NOTE — Progress Notes (Signed)
Alert and oriented. Sinus Rhythm 1st degree on telemetry. Rested quietly most of the day. No complaints. Family at bedside today. Full assessment as charted. Independent in room. Off unit for various tests this afternoon.  Theodore Reid, [name]

## 2015-04-17 LAB — BASIC METABOLIC PANEL
Anion gap: 8 (ref 5–15)
BUN: 20 mg/dL (ref 6–20)
CHLORIDE: 94 mmol/L — AB (ref 101–111)
CO2: 36 mmol/L — ABNORMAL HIGH (ref 22–32)
CREATININE: 0.88 mg/dL (ref 0.61–1.24)
Calcium: 8.6 mg/dL — ABNORMAL LOW (ref 8.9–10.3)
GFR calc Af Amer: >60 mL/min (ref >60)
Glucose, Bld: 106 mg/dL — ABNORMAL HIGH (ref 65–99)
Potassium: 4.1 mmol/L (ref 3.5–5.1)
Sodium: 138 mmol/L (ref 135–145)

## 2015-04-17 MED ORDER — ASPIRIN EC 81 MG PO TBEC: 81.0000 mg | DELAYED_RELEASE_TABLET | Freq: Every day | ORAL | Status: AC

## 2015-04-17 MED ORDER — ALBUTEROL SULFATE HFA 108 (90 BASE) MCG/ACT IN AERS: 2.0000 | INHALATION_SPRAY | RESPIRATORY_TRACT | Status: AC | PRN

## 2015-04-17 MED ORDER — FUROSEMIDE 20 MG PO TABS: 20.0000 mg | ORAL_TABLET | Freq: Two times a day (BID) | ORAL | Status: DC

## 2015-04-17 MED ORDER — PREDNISONE 50 MG PO TABS: 50.0000 mg | ORAL_TABLET | Freq: Every day | ORAL | Status: DC

## 2015-04-17 NOTE — Plan of Care (Signed)
Problem: Phase II Progression Outcomes Goal: O2 sats > equal to 90% on RA or at baseline Outcome: Progressing Patient on 2 liters oxygen.

## 2015-04-17 NOTE — Progress Notes (Signed)
Discharge instructions explained to pt/ verbalized an understanding/ iv and tele removed/ rx given to pt/ o2 delivered to room/ transported off unit via wheelchair.

## 2015-04-17 NOTE — Progress Notes (Signed)
Telemetry reading NSR. Alert and oriented x 4. Oxygen saturation 94-96% on 2 liters via nasal cannula. Nursing assistance reported to patient sleep and oxygen cannula off lying on bed. She check oxygen saturation with result 84%. Explained importance of keeping oxygen on to prevent respiratory distress.

## 2015-04-17 NOTE — Progress Notes (Signed)
Script for 2L N/C obtained from Dr Amado CoeGouru and faxed and called to [facility]. [facility] and home oxygen will be delivered to Theodore Reid's home today.

## 2015-04-17 NOTE — Discharge Summary (Signed)
[facility] - [place] at [facility]   PATIENT NAME: Theodore Reid    MR#:  [id number]  DATE OF BIRTH:  November 20, 1986  DATE OF ADMISSION:  04/14/2015 ADMITTING PHYSICIAN: Enid Baas, MD  DATE OF DISCHARGE: 04/17/2015  PRIMARY CARE PHYSICIAN: No primary care provider on file.    ADMISSION DIAGNOSIS:  Hypoxia [R09.02] Acute congestive heart failure, unspecified congestive heart failure type [I50.9]  DISCHARGE DIAGNOSIS:  Active Problems:   Acute hypoxic respiratory failure secondary to pulmonary congestion, he needs 2 L of home O2   Acute diastolic congestive heart failure Right heart strain secondary to obstructive sleep apnea Acute gouty arthritis Morbid obesity    SECONDARY DIAGNOSIS:   Past Medical History  Diagnosis Date  . Morbid obesity   . Gout   . History of echocardiogram     a. a. echo 04/15/2015: EF 60-65%, no WMA, LA nl in size, RV poorly visualized, PASP nl, grossly nl study, challening study 2/2 body habitus     HOSPITAL COURSE:  Brief history and physical- patient is a 29 year old morbidly obese patient went to urgent care secondary to left ankle pain. Patient was found to be hypoxic with a pulse ox at 86% on room air and he was sent over to the ED. Chest x-ray has revealed possible pulmonary vascular congestion. Patient has been admitted to the hospital for possible CHF.Please review history and physical for complete details  Hospital course  *1 acute hypoxic respiratory failure-likely second to pulmonary vascular congestion. Likely has right heart strain from sleep apnea and diastolic congestive heart failure . Patient was initially placed on BiPAP, feeling better, Weaned off BiPAP, Currently on 2 L oxygen, the patient was hypoxic with 82% pulse ox on room air with exertion. On 2 L with exertion he was saturating at 93%. IV Lasix was given initially and patient had significant diuresis. Net loss is 4320 mL Lasix is changed to by  mouth twice a day. His potassium is at 4.1 so not adding any potassium supplements.  Echocardiogram with normal ejection fraction at 60-65 % .   patient was evaluated by pulmonology Dr. Welton Flakes,  has recommended outpatient pulmonary function tests and sleep study.  Patient was seen by cardiology, Dr. Kirke Corin, has recommended low-dose diuresis with Lasix and outpatient follow-up. Patient is recommended to follow up with outpatient CHF clinic. We have recommended him to monitor his weights on daily basis  *D-dimer is elevated with the bilateral calf tenderness and morbidly obese body habitus. -Stat bilateral lower extending to venous Dopplers Negative for DVT -  patient was started on  empiric treatment with Xarelto 15 mg by mouth twice a dayEvaluating for a VQ scan which was low probability scan for pulmonary embolism, Xarelto was discontinued   * acute gout of left ankle-x-rays negative. Clinically feeling better. on prednisone x 5 days.  *Morbid obesity-patient will be benefited with bariatric surgery, in the interim lifestyle changes were recommended  *Nicotine abuse-counseled patient to quit smoking. Recommended to use over-the-counter nicotine patch, he is agreeable  DISCHARGE CONDITIONS:  Satisfactory  CONSULTS OBTAINED:  Treatment Team:  Yevonne Pax, MD Antonieta Iba, MD Ramonita Lab, MD   PROCEDURESnone    DRUG ALLERGIES:  Not on File  DISCHARGE MEDICATIONS:   Current Discharge Medication List    START taking these medications   Details  albuterol (PROVENTIL HFA;VENTOLIN HFA) 108 (90 BASE) MCG/ACT inhaler Inhale 2 puffs into the lungs every 4 (four) hours as needed for wheezing or  shortness of breath. Qty: 1 Inhaler, Refills: 2    aspirin EC 81 MG tablet Take 1 tablet (81 mg total) by mouth daily. Qty: 30 tablet, Refills: 0    furosemide (LASIX) 20 MG tablet Take 1 tablet (20 mg total) by mouth 2 (two) times daily. Qty: 60 tablet, Refills: 0    predniSONE (DELTASONE)  50 MG tablet Take 1 tablet (50 mg total) by mouth daily with breakfast. Qty: 4 tablet, Refills: 0      CONTINUE these medications which have NOT CHANGED   Details  acetaminophen (TYLENOL) 325 MG tablet Take 650 mg by mouth every 6 (six) hours as needed for headache.    ibuprofen (ADVIL,MOTRIN) 200 MG tablet Take 200 mg by mouth every 6 (six) hours as needed for headache or mild pain.         DISCHARGE INSTRUCTIONS:    monitor weights and daily basis   outpatient follow-up with CHF clinic in a week Outpatient follow-up with cardiology-Dr. Lewie Loron in a week Outpatient follow-up with Dr. Welton Flakes pulmonology in a week   DIET:  Low-salt, low-fat   DISCHARGE CONDITION:   satisfactory  ACTIVITY:   as tolerated  OXYGEN:  Home Oxygen: 2 L of oxygen via nasal cannula    Oxygen Delivery: 2 L via nasal cannula   DISCHARGE LOCATION:  Home with home O2   If you experience worsening of your admission symptoms, develop shortness of breath, life threatening emergency, suicidal or homicidal thoughts you must seek medical attention immediately by calling 911 or calling your MD immediately  if symptoms less severe.  You Must read complete instructions/literature along with all the possible adverse reactions/side effects for all the Medicines you take and that have been prescribed to you. Take any new Medicines after you have completely understood and accpet all the possible adverse reactions/side effects.   Please note  You were cared for by a hospitalist during your hospital stay. If you have any questions about your discharge medications or the care you received while you were in the hospital after you are discharged, you can call the unit and asked to speak with the hospitalist on call if the hospitalist that took care of you is not available. Once you are discharged, your primary care physician will handle any further medical issues. Please note that NO REFILLS for any discharge medications  will be authorized once you are discharged, as it is imperative that you return to your primary care physician (or establish a relationship with a primary care physician if you do not have one) for your aftercare needs so that they can reassess your need for medications and monitor your lab values.     Today  Chief Complaint  Patient presents with  . Leg Swelling    patient is feeling much better. Shortness of breath is improved. Leg pain is resolved  ROS:  CONSTITUTIONAL: Denies fevers, chills. Denies any fatigue, weakness.  EYES: Denies blurry vision, double vision, eye pain. EARS, NOSE, THROAT: Denies tinnitus, ear pain, hearing loss. RESPIRATORY: Denies cough, wheeze, shortness of breath.  CARDIOVASCULAR: Denies chest pain, palpitations, edema.  GASTROINTESTINAL: Denies nausea, vomiting, diarrhea, abdominal pain. Denies bright red blood per rectum. GENITOURINARY: Denies dysuria, hematuria. ENDOCRINE: Denies nocturia or thyroid problems. HEMATOLOGIC AND LYMPHATIC: Denies easy bruising or bleeding. SKIN: Denies rash or lesion. MUSCULOSKELETAL: Denies pain in neck, back, shoulder, knees, hips or arthritic symptoms.  NEUROLOGIC: Denies paralysis, paresthesias.  PSYCHIATRIC: Denies anxiety or depressive symptoms.   VITAL SIGNS:  Blood pressure 97/37, pulse 76, temperature 98.3 F (36.8 C), temperature source Oral, resp. rate 28, height 5\' 10"  (1.778 m), weight 200.98 kg (443 lb 1.3 oz), SpO2 94 %.  I/O:   Intake/Output Summary (Last 24 hours) at 04/17/15 1242 Last data filed at 04/17/15 0830  Gross per 24 hour  Intake    240 ml  Output   1925 ml  Net  -1685 ml    PHYSICAL EXAMINATION:  GENERAL:  29 y.o.-year-old patient lying in the bed with no acute distress.  EYES: Pupils equal, round, reactive to light and accommodation. No scleral icterus. Extraocular muscles intact.  HEENT: Head atraumatic, normocephalic. Oropharynx and nasopharynx clear.  NECK:  Supple, no jugular  venous distention. No thyroid enlargement, no tenderness.  LUNGS: Normal breath sounds bilaterally, no wheezing, rales,rhonchi or crepitation. No use of accessory muscles of respiration.  CARDIOVASCULAR: S1, S2 normal. No murmurs, rubs, or gallops.  ABDOMEN: Soft, non-tender, non-distended. Bowel sounds present. No organomegaly or mass.  EXTREMITIES: No pedal edema, cyanosis, or clubbing.  NEUROLOGIC: Cranial nerves II through XII are intact. Muscle strength 5/5 in all extremities. Sensation intact. Gait not checked.  PSYCHIATRIC: The patient is alert and oriented x 3.  SKIN: No obvious rash, lesion, or ulcer.   DATA REVIEW:   CBC  Recent Labs Lab 04/16/15 0617  WBC 11.3*  HGB 14.8  HCT 47.6  PLT 224    Chemistries   Recent Labs Lab 04/17/15 0410  NA 138  K 4.1  CL 94*  CO2 36*  GLUCOSE 106*  BUN 20  CREATININE 0.88  CALCIUM 8.6*    Cardiac Enzymes  Recent Labs Lab 04/14/15 1357  TROPONINI 0.03    Microbiology Results  No results found for this or any previous visit.  RADIOLOGY:  Dg Chest 2 View  04/16/2015   CLINICAL DATA:  29 year old male with shortness of Breath. Morbid obesity. Initial encounter.  EXAM: CHEST  2 VIEW  COMPARISON:  04/14/2015 and earlier.  FINDINGS: Chronic cardiomegaly may have mildly progressed since 2014. Large body habitus. Other mediastinal contours appear stable. Pulmonary vascular congestion may be mildly regressed since 04/14/2015. No pneumothorax, pleural effusion or consolidation. No acute osseous abnormality identified.  IMPRESSION: Cardiomegaly, suspect progressed since 2014. Pulmonary vascular congestion versus interstitial edema with mild regression since 04/14/2015.   Electronically Signed   By: Odessa FlemingH  Hall M.D.   On: 04/16/2015 16:04   Dg Chest 2 View  04/14/2015   CLINICAL DATA:  29 year old male with intermittent shortness of breath  EXAM: CHEST  2 VIEW  COMPARISON:  Prior chest x-ray 12/19/2012  FINDINGS: Borderline cardiomegaly  with pulmonary vascular congestion but no overt edema. No pleural effusion or pneumothorax. Radiographs are limited by body habitus. No acute osseous abnormality.  IMPRESSION: 1. Interval development of cardiomegaly and pulmonary vascular congestion bordering on mild interstitial edema. 2. Radiographs are slightly limited by patient body habitus.   Electronically Signed   By: Malachy MoanHeath  McCullough M.D.   On: 04/14/2015 14:32   Dg Ankle Complete Left  04/14/2015   CLINICAL DATA:  Left ankle pain  EXAM: LEFT ANKLE COMPLETE - 3+ VIEW  COMPARISON:  None.  FINDINGS: No acute fracture. No dislocation. Minimal spurring at the posterior calcaneus. Diffuse soft tissue swelling about the ankle joint.  IMPRESSION: No acute bony pathology. Soft tissue swelling about the ankle is noted.   Electronically Signed   By: Jolaine ClickArthur  Hoss M.D.   On: 04/14/2015 14:33   Nm Pulmonary Perf And  Vent  04/16/2015   CLINICAL DATA:  General fatigue, shortness of breath for a few months.  EXAM: NUCLEAR MEDICINE VENTILATION - PERFUSION LUNG SCAN  TECHNIQUE: Ventilation images were obtained in multiple projections using inhaled aerosol Tc-77m DTPA. Perfusion images were obtained in multiple projections after intravenous injection of Tc-34m MAA.  RADIOPHARMACEUTICALS:  51.4 mCi Technetium-7m DTPA aerosol inhalation and 5.9 mCi Technetium-28m MAA IV  COMPARISON:  Chest x-ray performed today.  FINDINGS: Ventilation: Patchy ventilation defects, likely related to DTPA clumping  Perfusion: No wedge shaped peripheral perfusion defects to suggest acute pulmonary embolism.  IMPRESSION: No perfusion defects to suggest pulmonary embolus.   Electronically Signed   By: Charlett Nose M.D.   On: 04/16/2015 19:39   US Venous Img Lower Bilateral  04/16/2015   CLINICAL DATA:  Shortness of breath times 2-3 months. Previous tobacco abuse.  EXAM: BILATERAL LOWER EXTREMITY VENOUS DOPPLER ULTRASOUND  TECHNIQUE: Gray-scale sonography with compression, as well as color  and duplex ultrasound, were performed to evaluate the deep venous system from the level of the common femoral vein through the popliteal and proximal calf veins.  COMPARISON:  None  FINDINGS: Normal compressibility of the common femoral, superficial femoral, and popliteal veins, as well as the proximal calf veins. No filling defects to suggest DVT on grayscale or color Doppler imaging. Doppler waveforms show normal direction of venous flow, normal respiratory phasicity and response to augmentation. Study degradation secondary to body habitus.  IMPRESSION: 1. No evidence of lower extremity deep vein thrombosis, BILATERALLY   Electronically Signed   By: Corlis Leak M.D.   On: 04/16/2015 16:06    EKG:   Orders placed or performed during the hospital encounter of 04/14/15  . EKG 12-Lead  . EKG 12-Lead      Management plans discussed with the patient, family and they are in agreement.  CODE STATUS:     Code Status Orders        Start     Ordered   04/14/15 1735  Full code   Continuous     04/14/15 1734      TOTAL TIME TAKING CARE OF THIS PATIENT: 45 minutes.    @  on 04/17/2015 at 12:42 PM  Between 7am to 6pm - Pager - [phone]  After 6pm go to [email or web address] - password [email or web address] [facility]  [facility]  Office  [phone]  CC: Primary care physician; No primary care provider on file.

## 2015-04-17 NOTE — Consult Note (Signed)
Pulmonary Critical Care  Initial Consult Note   Theodore Reid ZOX:[id number] DOB: [birth year] DOA: 04/14/2015  Referring physician: Hortense Ramal, MD PCP: No primary care provider on file.   Chief Complaint: Shortness of Breath  HPI: Theodore Reid is a 29 y.o. male with morbid obesity presented to the hospital with acute congestive heart failure and shortness of breath. Patient has morbid obesity but no history of diabetes and no prior history of CHF. Patient had noted increased weight gain and had been having some cough noted. Patient had increased ankle swelling noted also. Patient has not had a sleep study done in the past. Patient has had snoring and has had some issues with gasping at night. He was initially seen in the ED and had an ABG done which showed pH 7.33 and a pCO2 of 70. Patient was started on BIPAP at that time and did show some improvement with respiratory status. In addition he had a strongly positive D-dimer but due to his weight was not able to have a CT angiogram. A VQ scan was done which showed low probability and a doppler was negative for DVT.    Review of Systems:  Constitutional:  +weight gain, no night sweats, Fevers, +fatigue.  HEENT:  No headaches, nasal congestion, post nasal drip,  Cardio-vascular:  No chest pain, +Orthopnea, +swelling in lower extremities, no palpitations  GI:  No heartburn, indigestion, abdominal pain, nausea  Resp:  +shortness of breath. No coughing up of blood Skin:  no rash or lesions.  Musculoskeletal:  No joint pain or swelling  Remainder ROS performed and is unremarkable other than noted in HPI  Past Medical History  Diagnosis Date  . Morbid obesity   . Gout   . History of echocardiogram     a. a. echo 04/15/2015: EF 60-65%, no WMA, LA nl in size, RV poorly visualized, PASP nl, grossly nl study, challening study 2/2 body habitus    Past Surgical History  Procedure Laterality Date  . Tonsillectomy     Social History:   reports that he has been smoking Cigarettes.  He has been smoking about 1.00 pack per day. He does not have any smokeless tobacco history on file. He reports that he does not drink alcohol or use illicit drugs.  Not on File  Family History  Problem Relation Age of Onset  . Diabetes Mother   . CAD Mother   . Hypertension Mother   . Diabetes Father   . Cancer Father     rectal    Prior to Admission medications   Medication Sig Start Date End Date Taking? Authorizing Provider  acetaminophen (TYLENOL) 325 MG tablet Take 650 mg by mouth every 6 (six) hours as needed for headache.   Yes Historical Provider, MD  ibuprofen (ADVIL,MOTRIN) 200 MG tablet Take 200 mg by mouth every 6 (six) hours as needed for headache or mild pain.   Yes Historical Provider, MD   Physical Exam: Filed Vitals:   04/17/15 0436 04/17/15 0915 04/17/15 0918 04/17/15 0921  BP: 123/71 108/49    Pulse: 87 74    Temp:      TempSrc:      Resp: 21 20    Height:      Weight: 200.98 kg (443 lb 1.3 oz)     SpO2: 91% 92% 82% 96%    Wt Readings from Last 3 Encounters:  04/17/15 200.98 kg (443 lb 1.3 oz)    General:  Appears calm and comfortable Eyes:  PERRL, normal lids, irises & conjunctiva ENT: grossly normal hearing, lips & tongue Neck: no LAD, masses Cardiovascular: RRR, no m/r/g. ++LE edema. Respiratory: CTA bilaterally, no w/r/r. Normal respiratory effort. Abdomen: soft obese Skin: no rash seen on limited exam Musculoskeletal: grossly normal tone BUE/BLE Psychiatric: grossly normal mood and affect Neurologic: grossly non-focal.          Labs on Admission:  Basic Metabolic Panel:  Recent Labs Lab 04/14/15 1357 04/15/15 0429 04/16/15 0617 04/17/15 0410  NA 141 140 139 138  K 4.0 4.5 3.9 4.1  CL 97* 99* 96* 94*  CO2 35* 33* 36* 36*  GLUCOSE 75 108* 98 106*  BUN 17 19 19 20   CREATININE 0.98 0.91 0.90 0.88  CALCIUM 8.3* 8.6* 8.4* 8.6*   Liver Function Tests: No results for input(s): AST, ALT,  ALKPHOS, BILITOT, PROT, ALBUMIN in the last 168 hours. No results for input(s): LIPASE, AMYLASE in the last 168 hours. No results for input(s): AMMONIA in the last 168 hours. CBC:  Recent Labs Lab 04/14/15 1357 04/15/15 0429 04/16/15 0617  WBC 10.7* 8.9 11.3*  HGB 13.9 14.7 14.8  HCT 44.1 46.9 47.6  MCV 78.6* 79.7* 79.6*  PLT 210 226 224   Cardiac Enzymes:  Recent Labs Lab 04/14/15 1357  TROPONINI 0.03    BNP (last 3 results) No results for input(s): BNP in the last 8760 hours.  ProBNP (last 3 results) No results for input(s): PROBNP in the last 8760 hours.  CBG: No results for input(s): GLUCAP in the last 168 hours.  Radiological Exams on Admission: Dg Chest 2 View  04/16/2015   CLINICAL DATA:  29 year old male with shortness of Breath. Morbid obesity. Initial encounter.  EXAM: CHEST  2 VIEW  COMPARISON:  04/14/2015 and earlier.  FINDINGS: Chronic cardiomegaly may have mildly progressed since 2014. Large body habitus. Other mediastinal contours appear stable. Pulmonary vascular congestion may be mildly regressed since 04/14/2015. No pneumothorax, pleural effusion or consolidation. No acute osseous abnormality identified.  IMPRESSION: Cardiomegaly, suspect progressed since 2014. Pulmonary vascular congestion versus interstitial edema with mild regression since 04/14/2015.   Electronically Signed   By: Odessa FlemingH  Hall M.D.   On: 04/16/2015 16:04   Nm Pulmonary Perf And Vent  04/16/2015   CLINICAL DATA:  General fatigue, shortness of breath for a few months.  EXAM: NUCLEAR MEDICINE VENTILATION - PERFUSION LUNG SCAN  TECHNIQUE: Ventilation images were obtained in multiple projections using inhaled aerosol Tc-415m DTPA. Perfusion images were obtained in multiple projections after intravenous injection of Tc-4615m MAA.  RADIOPHARMACEUTICALS:  51.4 mCi Technetium-4615m DTPA aerosol inhalation and 5.9 mCi Technetium-4015m MAA IV  COMPARISON:  Chest x-ray performed today.  FINDINGS: Ventilation:  Patchy ventilation defects, likely related to DTPA clumping  Perfusion: No wedge shaped peripheral perfusion defects to suggest acute pulmonary embolism.  IMPRESSION: No perfusion defects to suggest pulmonary embolus.   Electronically Signed   By: Charlett NoseKevin  Dover M.D.   On: 04/16/2015 19:39   [place] Venous Img Lower Bilateral  04/16/2015   CLINICAL DATA:  Shortness of breath times 2-3 months. Previous tobacco abuse.  EXAM: BILATERAL LOWER EXTREMITY VENOUS DOPPLER ULTRASOUND  TECHNIQUE: Gray-scale sonography with compression, as well as color and duplex ultrasound, were performed to evaluate the deep venous system from the level of the common femoral vein through the popliteal and proximal calf veins.  COMPARISON:  None  FINDINGS: Normal compressibility of the common femoral, superficial femoral, and popliteal veins, as well as the proximal calf veins. No filling  defects to suggest DVT on grayscale or color Doppler imaging. Doppler waveforms show normal direction of venous flow, normal respiratory phasicity and response to augmentation. Study degradation secondary to body habitus.  IMPRESSION: 1. No evidence of lower extremity deep vein thrombosis, BILATERALLY   Electronically Signed   By: Corlis Leak M.D.   On: 04/16/2015 16:06    EKG: Independently reviewed.  Assessment/Plan Active Problems:   Hypoxia   CHF (congestive heart failure)   1. Acute on Chronic Respiratory failure with hypercapnea -he likely has baseline hypercapnea as noted by his ABG -I suspect he has OHS underlying and needs to have a sleep evaluation which will be done as an outpatient. -assess for oxygen prior to discharge with RA ambulatory pulsox  2. CHF -per cardiology -diureses as needed  3. Morbid obesity -instruction on weight loss -will benefit from evaluation for bariatric surgery  4. Hypersomnia with OSA possibly OHS -will schedule for a sleep study as an outpatient  5. Tobacco Abuse -smoking cessation  counseling  Code Status: Full Code (must indicate code status--if unknown or must be presumed, indicate so) DVT Prophylaxis:Ambulatory Family Communication: Aunt (indicate person spoken with, if applicable, with phone number if by telephone) Disposition Plan: Home (indicate anticipated LOS)  Time spent:    I have personally obtained a history, examined the patient, evaluated laboratory and imaging results, formulated the assessment and plan and placed orders.  The Patient requires high complexity decision making for assessment and support.    Yevonne Pax, MD [facility] Pulmonary Critical Care Medicine Sleep Medicine

## 2015-04-19 ENCOUNTER — Telehealth: Payer: Self-pay

## 2015-04-19 NOTE — Telephone Encounter (Signed)
-----   Message from Christell Faithraci L Tekely sent at 04/19/2015 11:24 AM EDT ----- Regarding: tcm/ph Dr. Mariah MillingGollan 04/22/15

## 2015-04-19 NOTE — Telephone Encounter (Signed)
[facility]  Patient understands to follow up with provider Dr. Mariah MillingGollan on 04/22/15 at 10[facility]. Patient understands discharge instructions? yes Patient understands medications and regiment? yes Patient understands to bring all medications to this visit? yes

## 2015-04-22 ENCOUNTER — Encounter: Payer: Self-pay | Admitting: Cardiovascular Disease

## 2015-04-22 ENCOUNTER — Ambulatory Visit (INDEPENDENT_AMBULATORY_CARE_PROVIDER_SITE_OTHER): Payer: BLUE CROSS/BLUE SHIELD | Admitting: Cardiovascular Disease

## 2015-04-22 VITALS — BP 160/90 | HR 83 | Ht 70.0 in | Wt >= 6400 oz

## 2015-04-22 DIAGNOSIS — G4730 Sleep apnea, unspecified: Secondary | ICD-10-CM

## 2015-04-22 DIAGNOSIS — E662 Morbid (severe) obesity with alveolar hypoventilation: Secondary | ICD-10-CM

## 2015-04-22 DIAGNOSIS — R0602 Shortness of breath: Secondary | ICD-10-CM

## 2015-04-22 DIAGNOSIS — I5032 Chronic diastolic (congestive) heart failure: Secondary | ICD-10-CM | POA: Diagnosis not present

## 2015-04-22 DIAGNOSIS — R0902 Hypoxemia: Secondary | ICD-10-CM

## 2015-04-22 NOTE — Progress Notes (Signed)
Patient ID: Theodore Reid, male    DOB: June 17, 1986, 29 y.o.   MRN: [id number]  HPI Comments: Theodore Reid is a 29 year old gentleman with morbid obesity, obesity hypoventilation syndrome, weight more than 500 pounds, recently in the hospital for shortness of breath, leg swelling, improved symptoms with oxygen, diuresis, who presents to the [place] office to establish care  He reports feeling well since his discharge. He has been using 2 L nasal cannula. He has continued on Lasix twice a day, has stopped smoking. Reports feeling much better in general. He is scheduled to have sleep study next week, follow up with heart failure clinic as well. 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  Ablating around the office today, saturations in the 90s with walking, at rest back to the 80s, 86%, improved with rest and deep inspiration back to 94  EKG shows normal sinus rhythm with rate 83 bpm, no significant ST or T-wave changes     No Known Allergies  Current Outpatient Prescriptions on File Prior to Visit  Medication Sig Dispense Refill  . acetaminophen (TYLENOL) 325 MG tablet Take 650 mg by mouth every 6 (six) hours as needed for headache.    . albuterol (PROVENTIL HFA;VENTOLIN HFA) 108 (90 BASE) MCG/ACT inhaler Inhale 2 puffs into the lungs every 4 (four) hours as needed for wheezing or shortness of breath. 1 Inhaler 2  . aspirin EC 81 MG tablet Take 1 tablet (81 mg total) by mouth daily. 30 tablet 0  . furosemide (LASIX) 20 MG tablet Take 1 tablet (20 mg total) by mouth 2 (two) times daily. 60 tablet 0  . ibuprofen (ADVIL,MOTRIN) 200 MG tablet Take 200 mg by mouth every 6 (six) hours as needed for headache or mild pain.    . predniSONE (DELTASONE) 50 MG tablet Take 1 tablet (50 mg total) by mouth daily with breakfast. 4 tablet 0   No current facility-administered medications on file prior to visit.    Past Medical History   Diagnosis Date  . Morbid obesity   . Gout   . History of echocardiogram     a. a. echo 04/15/2015: EF 60-65%, no WMA, LA nl in size, RV poorly visualized, PASP nl, grossly nl study, challening study 2/2 body habitus     Past Surgical History  Procedure Laterality Date  . Tonsillectomy      Social History  reports that he quit smoking 8 days ago. His smoking use included Cigarettes. He has a 11 pack-year smoking history. He does not have any smokeless tobacco history on file. He reports that he does not drink alcohol or use illicit drugs.  Family History family history includes CAD in his mother; Cancer in his father; Diabetes in his father and mother; Hypertension in his mother.   Review of Systems  Constitutional: Negative.   Respiratory: Positive for shortness of breath.   Cardiovascular: Negative.   Gastrointestinal: Negative.   Musculoskeletal: Negative.   Skin: Negative.   Neurological: Negative.   Hematological: Negative.   Psychiatric/Behavioral: Negative.   All other systems reviewed and are negative.   BP 160/90 mmHg  Pulse 83  Ht  (1.778 m)  Wt 556 lb (252.2 kg)  BMI 79.78 kg/m2  SpO2 97% Blood pressure was difficult to obtain. Repeat blood pressure improved to 140 systolic Physical Exam  Constitutional: He is oriented to person, place, and time. He appears  well-developed and well-nourished.  HENT:  Head: Normocephalic.  Nose: Nose normal.  Mouth/Throat: Oropharynx is clear and moist.  Eyes: Conjunctivae are normal. Pupils are equal, round, and reactive to light.  Neck: Normal range of motion. Neck supple. No JVD present.  Cardiovascular: Normal rate, regular rhythm, normal heart sounds and intact distal pulses.  Exam reveals no gallop and no friction rub.   No murmur heard. Pulmonary/Chest: Effort normal and breath sounds normal. No respiratory distress. He has no wheezes. He has no rales. He exhibits no tenderness.  Abdominal: Soft. Bowel sounds  are normal. He exhibits no distension. There is no tenderness.  Musculoskeletal: Normal range of motion. He exhibits no edema or tenderness.  Lymphadenopathy:    He has no cervical adenopathy.  Neurological: He is alert and oriented to person, place, and time. Coordination normal.  Skin: Skin is warm and dry. No rash noted. No erythema.  Psychiatric: He has a normal mood and affect. His behavior is normal. Judgment and thought content normal.

## 2015-04-22 NOTE — Patient Instructions (Signed)
You are doing well. No medication changes were made.  Low carbo diet  Exercise, low impact  Please call us if you have new issues that need to be addressed before your next appt.  Your physician wants you to follow-up in: 6 months.  You will receive a reminder letter in the mail two months in advance. If you don't receive a letter, please call our office to schedule the follow-up appointment.

## 2015-04-22 NOTE — Assessment & Plan Note (Signed)
He did have hypoxia with walking without oxygen. Down to 86% after ambulating in the office, took more than 60 seconds to improve back up to 94%. He would like to go back to work. We have recommended he exert himself in a paced fashion

## 2015-04-22 NOTE — Assessment & Plan Note (Signed)
The weight is the major issue to deal with and was discussed on today's visit Recommended a diet. Mother was here with him. Recommended a exercise program. He seems motivated

## 2015-04-22 NOTE — Assessment & Plan Note (Addendum)
Recommended he stay on his Lasix 20 mg twice a day Will need periodic blood work. Perhaps this could be done with heart failure clinic in one week Currently not on potassium. He will take bananas. Prescription of potassium called in if he prefers

## 2015-04-22 NOTE — Assessment & Plan Note (Signed)
Sleep study scheduled for next week.  

## 2015-04-28 ENCOUNTER — Ambulatory Visit: Payer: BLUE CROSS/BLUE SHIELD | Attending: Family | Admitting: Family

## 2015-04-28 ENCOUNTER — Telehealth: Payer: Self-pay | Admitting: Cardiovascular Disease

## 2015-04-28 ENCOUNTER — Telehealth: Payer: Self-pay

## 2015-04-28 ENCOUNTER — Telehealth: Payer: Self-pay | Admitting: Family

## 2015-04-28 ENCOUNTER — Encounter: Payer: Self-pay | Admitting: Family

## 2015-04-28 VITALS — BP 122/80 | HR 96 | Resp 20 | Ht 67.0 in | Wt >= 6400 oz

## 2015-04-28 DIAGNOSIS — R5383 Other fatigue: Secondary | ICD-10-CM | POA: Insufficient documentation

## 2015-04-28 DIAGNOSIS — I509 Heart failure, unspecified: Secondary | ICD-10-CM | POA: Diagnosis present

## 2015-04-28 DIAGNOSIS — E669 Obesity, unspecified: Secondary | ICD-10-CM | POA: Insufficient documentation

## 2015-04-28 DIAGNOSIS — I5032 Chronic diastolic (congestive) heart failure: Secondary | ICD-10-CM

## 2015-04-28 DIAGNOSIS — G4730 Sleep apnea, unspecified: Secondary | ICD-10-CM | POA: Diagnosis not present

## 2015-04-28 DIAGNOSIS — E662 Morbid (severe) obesity with alveolar hypoventilation: Secondary | ICD-10-CM

## 2015-04-28 LAB — BASIC METABOLIC PANEL
Anion gap: 9 (ref 5–15)
BUN: 20 mg/dL (ref 6–20)
CO2: 32 mmol/L (ref 22–32)
CREATININE: 0.84 mg/dL (ref 0.61–1.24)
Calcium: 8.8 mg/dL — ABNORMAL LOW (ref 8.9–10.3)
Chloride: 97 mmol/L — ABNORMAL LOW (ref 101–111)
GLUCOSE: 112 mg/dL — AB (ref 65–99)
Potassium: 4.1 mmol/L (ref 3.5–5.1)
Sodium: 138 mmol/L (ref 135–145)

## 2015-04-28 NOTE — Telephone Encounter (Signed)
Do we still us the agency to complete our paperwork [facility]?

## 2015-04-28 NOTE — Telephone Encounter (Signed)
Placed paperwork on Dr. Windell HummingbirdGollan's desk for completion.

## 2015-04-28 NOTE — Telephone Encounter (Signed)
Patient mother dropped off FMLA papers and needs these and work note asap.  Please call mother when ready.

## 2015-04-28 NOTE — Telephone Encounter (Signed)
Please call patient and tell him that his lab results came back and his potassium and kidney function are normal. Continue eating potassium rich foods as he's been doing.

## 2015-04-28 NOTE — Telephone Encounter (Signed)
Pt mom is calling asking if pt can go back to work by 6/13. States she dropped off some paperwork for Dr. Reece AgarG to fill out. Please call.

## 2015-04-28 NOTE — Progress Notes (Signed)
Subjective:    Patient ID: Theodore Reid, male    DOB: 03-03-1986, 29 y.o.   MRN: [id number]  Shortness of Breath This is a new problem. The current episode started 1 to 4 weeks ago. The problem occurs rarely. The problem has been gradually improving. Associated symptoms include leg swelling (sometimes). Pertinent negatives include no abdominal pain, chest pain, neck pain, PND or sore throat. Nothing aggravates the symptoms. Risk factors include smoking. He has tried oral steroids for the symptoms. The treatment provided significant relief. His past medical history is significant for a heart failure.  Congestive Heart Failure Presents for initial visit. The disease course has been stable. Associated symptoms include edema, fatigue (with long distances), palpitations (when walking long distances) and shortness of breath (walking long distances such as at [employer]). Pertinent negatives include no abdominal pain or chest pain. The symptoms have been stable. Past treatments include salt and fluid restriction and oxygen. The treatment provided significant relief. Compliance with prior treatments has been good. There is no history of DM or HTN.      Review of Systems  Constitutional: Positive for fatigue (with long distances). Negative for appetite change.  HENT: Negative for sore throat and trouble swallowing.   Eyes: Negative.   Respiratory: Positive for cough (dry cough) and shortness of breath (walking long distances such as at [employer]).   Cardiovascular: Positive for palpitations (when walking long distances) and leg swelling (sometimes). Negative for chest pain and PND.  Gastrointestinal: Negative for abdominal pain and abdominal distention.  Endocrine: Negative.   Genitourinary: Negative.   Musculoskeletal: Negative for back pain and neck pain.  Skin: Negative.   Allergic/Immunologic: Negative.   Neurological: Negative for dizziness, weakness and numbness.  Hematological: Negative for  adenopathy. Does not bruise/bleed easily.  Psychiatric/Behavioral: Negative for sleep disturbance and agitation.       Objective:   Physical Exam  Constitutional: He is oriented to person, place, and time. He appears well-developed and well-nourished.  HENT:  Head: Normocephalic and atraumatic.  Eyes: Conjunctivae are normal. Pupils are equal, round, and reactive to light.  Neck: Normal range of motion. Neck supple.  Cardiovascular: Normal rate and regular rhythm.   Pulmonary/Chest: Effort normal and breath sounds normal. He has no rales.  Abdominal: Soft. He exhibits no distension. There is no tenderness.  Musculoskeletal: He exhibits edema (1+ pitting bilateral with L>R). He exhibits no tenderness.  Neurological: He is alert and oriented to person, place, and time.  Skin: Skin is warm and dry.  Psychiatric: He has a normal mood and affect. His behavior is normal. Thought content normal.  Nursing note and vitals reviewed.         Assessment & Plan:  1: Chronic heart failure with preserved ejection fraction- Patient presents with shortness of breath and fatigue only with long distance walking, such as at [employer]. Denied any symptoms walking into the office today. Doesn't have scales at home but says that there are scales at work. Discussed the importance of getting scales for his weight and to call for an overnight weight gain of >2 pounds or a weekly weight gain of >5 pounds. We do not have scales for over 440 pounds. He does notice some swelling in his lower legs when he sits for long periods. Encouraged him to elevate his legs when he's at home. He does not add salt to his food now and has been reading food labels. Reviewed a 2000mg  sodium diet with him and the importance of  following that. He admits that he's not very active and hasn't gone back to work yet. Discussed pulmonary rehab with him and a brochure was given to him. Will continue to discuss with him. Patient is taking  furosemide and has been eating potassium rich foods. Will check a chemistry panel today to make sure he's not hypokalemic. 2: Sleep apnea- Patient is scheduled to have a sleep study done next week. Does wear oxygen at night while he sleeps.  3: Obesity hypoventilatino syndrome- Oxygen level was a little low upon him walking into the office. Based on cardiologist's note, this is common so will continue to monitor.   Patient did not have a PCP so an appointment was made for him at [facility] on 06/04/15 to get established.  Return here in one month or sooner for any questions/problems before then.

## 2015-04-28 NOTE — Patient Instructions (Signed)
Weigh daily and call for an overnight weight gain of >2 pounds or a weekly weight gain of >5 pounds.   Do not add salt to foods.   Elevate legs when at home.   Brochure given regarding pulmonary rehab for possible referral.

## 2015-04-29 ENCOUNTER — Telehealth: Payer: Self-pay

## 2015-04-29 NOTE — Telephone Encounter (Signed)
Patients mother called the office. She states that Theodore NeedleMichael was sent home with oxygen through [facility]. He has to pay for it everyday he has it and she states he hasn't had to use it and is doing much better. They are wanting to know if it would be ok to return the oxygen since it is not being used or if it should be kept just in case. Please advise.

## 2015-04-29 NOTE — Telephone Encounter (Signed)
Coralee RudSabrina F Gilley at 04/28/2015 4:33 PM     Status: Signed       Expand All Collapse All   Pt mom is calling asking if pt can go back to work by 6/13. States she dropped off some paperwork for Dr. Reece AgarG to fill out. Please call.

## 2015-04-29 NOTE — Telephone Encounter (Signed)
Theodore Reid was present when pt's paperwork was dropped off. It was her recommendation that we can complete paperwork here, as it seemed minimal and pt needs it completed quicker than [employer] could get it back to us.

## 2015-04-29 NOTE — Telephone Encounter (Signed)
That decision would need to come from cardiologist's office as his oxygen level is low upon exertion.

## 2015-04-29 NOTE — Telephone Encounter (Signed)
Advised patient of normal lab results. Pt will follow up in office in one month advised to call sooner if needed.

## 2015-04-29 NOTE — Telephone Encounter (Signed)
Please see previous phone note.  

## 2015-04-30 NOTE — Telephone Encounter (Signed)
This encounter was created in error - please disregard.

## 2015-04-30 NOTE — Telephone Encounter (Signed)
Spoke w/ pt's mother.   Advised her that I am leaving completed FMLA paperwork and work note at the front desk for her to pick up at her convenience.  She is appreciative and will come by on Monday.

## 2015-04-30 NOTE — Telephone Encounter (Signed)
Pt mother called back, inquiring about paperwork for pt to return to work. I did inform her Dr. Mariah MillingGollan was out until 6/13. Please call with status .

## 2015-04-30 NOTE — Telephone Encounter (Signed)
Advised patient to check with Cardiologist about sending back his oxygen machine. He understands and will contact them.

## 2015-05-13 ENCOUNTER — Telehealth: Payer: Self-pay | Admitting: *Deleted

## 2015-05-13 NOTE — Telephone Encounter (Signed)
Pt c/o Shortness Of Breath: STAT if SOB developed within the last 24 hours or pt is noticeably SOB on the phone  1. Are you currently SOB (can you hear that pt is SOB on the phone)? Yes  2. How long have you been experiencing SOB? Went back to work on Monday and it started on Tuesday  3. Are you SOB when sitting or when up moving around? Both more so moving around  4. Are you currently experiencing any other symptoms? Back is hurting with the sob.  Patient's mother called can you please call her back.

## 2015-05-13 NOTE — Telephone Encounter (Signed)
His SpO2 did not drop when he walked in the office, so his O2 was d/c'd.  Do you want him to resume?

## 2015-05-14 ENCOUNTER — Ambulatory Visit: Payer: BLUE CROSS/BLUE SHIELD | Attending: Family | Admitting: Family

## 2015-05-14 ENCOUNTER — Encounter: Payer: Self-pay | Admitting: Family

## 2015-05-14 VITALS — BP 120/78 | HR 96 | Resp 18 | Ht 70.0 in | Wt >= 6400 oz

## 2015-05-14 DIAGNOSIS — G4730 Sleep apnea, unspecified: Secondary | ICD-10-CM | POA: Diagnosis not present

## 2015-05-14 DIAGNOSIS — Z7982 Long term (current) use of aspirin: Secondary | ICD-10-CM | POA: Diagnosis not present

## 2015-05-14 DIAGNOSIS — I509 Heart failure, unspecified: Secondary | ICD-10-CM | POA: Diagnosis not present

## 2015-05-14 DIAGNOSIS — Z79899 Other long term (current) drug therapy: Secondary | ICD-10-CM | POA: Insufficient documentation

## 2015-05-14 DIAGNOSIS — E662 Morbid (severe) obesity with alveolar hypoventilation: Secondary | ICD-10-CM

## 2015-05-14 DIAGNOSIS — R0602 Shortness of breath: Secondary | ICD-10-CM | POA: Insufficient documentation

## 2015-05-14 DIAGNOSIS — Z87891 Personal history of nicotine dependence: Secondary | ICD-10-CM | POA: Diagnosis not present

## 2015-05-14 DIAGNOSIS — I5032 Chronic diastolic (congestive) heart failure: Secondary | ICD-10-CM

## 2015-05-14 MED ORDER — METOLAZONE 5 MG PO TABS: 5.0000 mg | ORAL_TABLET | Freq: Every day | ORAL | Status: DC

## 2015-05-14 MED ORDER — FUROSEMIDE 20 MG PO TABS: 20.0000 mg | ORAL_TABLET | Freq: Two times a day (BID) | ORAL | Status: AC

## 2015-05-14 MED ORDER — FUROSEMIDE 20 MG PO TABS: 20.0000 mg | ORAL_TABLET | Freq: Two times a day (BID) | ORAL | Status: DC

## 2015-05-14 NOTE — Telephone Encounter (Signed)
Would restart oxygen if he feels SOB. He did desat after walking into the 80s, back in the exam room  Would he consider evaluation for gastric bypass surgery?

## 2015-05-14 NOTE — Patient Instructions (Signed)
Do not add salt to your food and monitor sodium content of foods.  Take metolazone daily for the next 3 days along with furosemide. Best to take it 1/2 hour prior to furosemide dose.

## 2015-05-14 NOTE — Telephone Encounter (Signed)
Spoke w/ pt's mother.  Advised her of Dr. Windell Hummingbird recommendation.  She reports that pt saw Clarisa Kindred, FNP today and she took pt out of work for a couple of weeks until reeval on 6/28. Pt is sched for sleep study on 05/27/15. 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 Discussed eval for gastric bypass surgery.  She states that she & pt have discussed this, but not decided if this is something he is interested in.  Pt's aunt had procedure done and had some complications, so he is hesitant.  Advised her that I can set up an eval to discuss this if he feels ready, but we are not pressuring him by any means.  She is appreciative of the call and will let us know if pt has any other questions or concerns.

## 2015-05-14 NOTE — Progress Notes (Signed)
Subjective:    Patient ID: Theodore Reid, male    DOB: 1986/03/23, 29 y.o.   MRN: [id number]  Shortness of Breath This is a recurrent problem. The current episode started in the past 7 days. The problem occurs daily. The problem has been gradually worsening. Associated symptoms include headaches, leg swelling and wheezing. Pertinent negatives include no abdominal pain, chest pain, fever, neck pain or sore throat. 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 The patient has no known risk factors for DVT/PE. He has tried beta agonist inhalers for the symptoms. The treatment provided mild relief. His past medical history is significant for a heart failure.  Dizziness This is a new problem. The current episode started in the past 7 days. The problem occurs daily. The problem has been gradually improving. Associated symptoms include coughing, fatigue and headaches. Pertinent negatives include no abdominal pain, chest pain, chills, congestion, fever, neck pain, sore throat, vertigo or visual change. The symptoms are aggravated by bending. He has tried lying down for the symptoms. The treatment provided mild relief.  Cough This is a new problem. The current episode started in the past 7 days. The problem has been unchanged. The problem occurs every few hours. The cough is non-productive. Associated symptoms include headaches, shortness of breath and wheezing. Pertinent negatives include no chest pain, chills, ear congestion, fever, heartburn, postnasal drip or sore throat. Nothing aggravates the symptoms. He has tried a beta-agonist inhaler for the symptoms. The treatment provided mild relief.   No Known Allergies Prior to Admission medications   Medication Sig Start Date End Date Taking? Authorizing Provider  acetaminophen (TYLENOL) 325 MG tablet Take 650 mg by mouth every 6 (six) hours as needed for headache.   Yes Historical Provider, MD  albuterol (PROVENTIL HFA;VENTOLIN  HFA) 108 (90 BASE) MCG/ACT inhaler Inhale 2 puffs into the lungs every 4 (four) hours as needed for wheezing or shortness of breath. 04/17/15  Yes Ramonita Lab, MD  aspirin EC 81 MG tablet Take 1 tablet (81 mg total) by mouth daily. 04/17/15  Yes Ramonita Lab, MD  furosemide (LASIX) 20 MG tablet Take 1 tablet (20 mg total) by mouth 2 (two) times daily. 05/14/15  Yes Delma Freeze, FNP  ibuprofen (ADVIL,MOTRIN) 200 MG tablet Take 200 mg by mouth every 6 (six) hours as needed for headache or mild pain.   Yes Historical Provider, MD  metolazone (ZAROXOLYN) 5 MG tablet Take 1 tablet (5 mg total) by mouth daily. Take for 3 days 1/2 hour prior to furosemide. 05/14/15   Delma Freeze, FNP   History  Substance Use Topics  . Smoking status: Former Smoker -- 1.00 packs/day for 11 years    Types: Cigarettes    Quit date: 04/14/2015  . Smokeless tobacco: Never Used  . Alcohol Use: No      Review of Systems  Constitutional: Positive for fatigue. Negative for fever, chills and appetite change.  HENT: Negative for congestion, postnasal drip and sore throat.   Eyes: Negative.   Respiratory: Positive for cough, shortness of breath and wheezing.   Cardiovascular: Positive for palpitations (when gets short of breath) and leg swelling. Negative for chest pain.  Gastrointestinal: Negative.  Negative for heartburn and abdominal pain.  Endocrine: Negative.   Genitourinary: Negative.   Musculoskeletal: Positive for back pain. Negative for neck pain.  Skin: Negative.   Allergic/Immunologic: Negative.   Neurological: Positive for dizziness and headaches. Negative for vertigo.  Hematological: Negative.  Psychiatric/Behavioral: Negative for sleep disturbance (sleeps on 2 pillows). The patient is not nervous/anxious.        Objective:   Physical Exam  Constitutional: He is oriented to person, place, and time. He appears well-developed and well-nourished.  HENT:  Head: Normocephalic and atraumatic.  Eyes:  Conjunctivae are normal. Pupils are equal, round, and reactive to light.  Neck: Normal range of motion. Neck supple.  Cardiovascular: Normal rate and regular rhythm.   Pulmonary/Chest: Breath sounds normal. No accessory muscle usage. No respiratory distress. He has no wheezes. He has no rales.  Abdominal: Soft. He exhibits no distension. There is no tenderness.  Musculoskeletal: He exhibits edema (2+ pitting edema in bilateral lower legs). He exhibits no tenderness.  Neurological: He is alert and oriented to person, place, and time.  Skin: Skin is warm and dry. No erythema.  Psychiatric: He has a normal mood and affect. His behavior is normal. Thought content normal.  Nursing note and vitals reviewed.  BP 120/78 mmHg  Pulse 96  Resp 18  Ht  (1.778 m)  Wt 564 lb (255.829 kg)  BMI 80.93 kg/m2  SpO2 88%     Assessment & Plan:  1: Acute on chronic heart failure with preserved ejection fraction- Patient presents with worsening shortness of breath over the last 5 days. He says that he was doing well until he returned to work on 05/10/15 and his symptoms started at that point. He works in a [employer] and says that he gets quite hot at work and he feels like the heat has made his symptoms return. He does admit to adding some salt to his food. He doesn't have a scale because of his weight. Patient's weight is up 15 pounds from his last visit with [place]. He also has pitting edema in his lower legs. Oxygen level is 88% on room air although it's been low previously. He does wear his oxygen as needed and he's been wearing it more often this past week. Discussed the importance of not adding any salt to his food and avoiding high sodium foods such as chinese food. Will add metolazone  daily for the next 3 days. He was instructed to take it 1/2 hour prior to his furosemide dose. Note given for patient to be out of work until seen back in the clinic on 05/25/15.  2: Sleep apnea- Patient is scheduled for a  sleep study on 05/27/15.  Return on 05/25/15. Should symptoms worsen before then, he's to call his cardiologist or present to urgent care.

## 2015-05-25 ENCOUNTER — Ambulatory Visit: Payer: BLUE CROSS/BLUE SHIELD | Attending: Family | Admitting: Family

## 2015-05-25 ENCOUNTER — Encounter: Payer: Self-pay | Admitting: Family

## 2015-05-25 VITALS — BP 130/82 | HR 97 | Resp 18 | Ht 70.0 in | Wt >= 6400 oz

## 2015-05-25 DIAGNOSIS — E669 Obesity, unspecified: Secondary | ICD-10-CM | POA: Diagnosis not present

## 2015-05-25 DIAGNOSIS — R0602 Shortness of breath: Secondary | ICD-10-CM | POA: Insufficient documentation

## 2015-05-25 DIAGNOSIS — Z87891 Personal history of nicotine dependence: Secondary | ICD-10-CM | POA: Diagnosis not present

## 2015-05-25 DIAGNOSIS — Z79899 Other long term (current) drug therapy: Secondary | ICD-10-CM | POA: Insufficient documentation

## 2015-05-25 DIAGNOSIS — I509 Heart failure, unspecified: Secondary | ICD-10-CM | POA: Diagnosis not present

## 2015-05-25 DIAGNOSIS — I5032 Chronic diastolic (congestive) heart failure: Secondary | ICD-10-CM

## 2015-05-25 DIAGNOSIS — Z7982 Long term (current) use of aspirin: Secondary | ICD-10-CM | POA: Diagnosis not present

## 2015-05-25 DIAGNOSIS — R000 Tachycardia, unspecified: Secondary | ICD-10-CM | POA: Insufficient documentation

## 2015-05-25 DIAGNOSIS — R5383 Other fatigue: Secondary | ICD-10-CM | POA: Diagnosis not present

## 2015-05-25 DIAGNOSIS — E662 Morbid (severe) obesity with alveolar hypoventilation: Secondary | ICD-10-CM

## 2015-05-25 LAB — BASIC METABOLIC PANEL
ANION GAP: 7 (ref 5–15)
BUN: 18 mg/dL (ref 6–20)
CALCIUM: 8.6 mg/dL — AB (ref 8.9–10.3)
CO2: 32 mmol/L (ref 22–32)
CREATININE: 0.84 mg/dL (ref 0.61–1.24)
Chloride: 99 mmol/L — ABNORMAL LOW (ref 101–111)
Glucose, Bld: 118 mg/dL — ABNORMAL HIGH (ref 65–99)
Potassium: 4 mmol/L (ref 3.5–5.1)
Sodium: 138 mmol/L (ref 135–145)

## 2015-05-25 NOTE — Progress Notes (Signed)
Subjective:    Patient ID: Theodore Reid, male    DOB: 05-Oct-1986, 29 y.o.   MRN: [id number]  Shortness of Breath This is a chronic problem. The current episode started more than 1 month ago. The problem occurs daily. The problem has been unchanged. Pertinent negatives include no abdominal pain, chest pain, headaches, leg swelling, neck pain, rhinorrhea or wheezing. The symptoms are aggravated by any activity. The patient has no known risk factors for DVT/PE. He has tried beta agonist inhalers and rest for the symptoms. The treatment provided mild relief. His past medical history is significant for a heart failure.  Congestive Heart Failure Presents for follow-up visit. The disease course has been stable. Associated symptoms include fatigue, palpitations (only when walking fast) and shortness of breath. Pertinent negatives include no abdominal pain, chest pain or edema. The symptoms have been stable. Past treatments include oxygen and salt and fluid restriction. The treatment provided mild relief.   No Known Allergies  Prior to Admission medications   Medication Sig Start Date End Date Taking? Authorizing Provider  acetaminophen (TYLENOL) 325 MG tablet Take 650 mg by mouth every 6 (six) hours as needed for headache.   Yes Historical Provider, MD  albuterol (PROVENTIL HFA;VENTOLIN HFA) 108 (90 BASE) MCG/ACT inhaler Inhale 2 puffs into the lungs every 4 (four) hours as needed for wheezing or shortness of breath. 04/17/15  Yes Ramonita Lab, MD  aspirin EC 81 MG tablet Take 1 tablet (81 mg total) by mouth daily. 04/17/15  Yes Ramonita Lab, MD  furosemide (LASIX) 20 MG tablet Take 1 tablet (20 mg total) by mouth 2 (two) times daily. 05/14/15  Yes Delma Freeze, FNP  ibuprofen (ADVIL,MOTRIN) 200 MG tablet Take 200 mg by mouth every 6 (six) hours as needed for headache or mild pain.   Yes Historical Provider, MD   History  Substance Use Topics  . Smoking status: Former Smoker -- 1.00 packs/day for 11  years    Types: Cigarettes    Quit date: 04/14/2015  . Smokeless tobacco: Never Used  . Alcohol Use: No      Review of Systems  Constitutional: Positive for fatigue. Negative for appetite change.  HENT: Negative for congestion and rhinorrhea.   Eyes: Negative.   Respiratory: Positive for cough (dry) and shortness of breath. Negative for wheezing.   Cardiovascular: Positive for palpitations (only when walking fast). Negative for chest pain and leg swelling.  Gastrointestinal: Negative for abdominal pain and abdominal distention.  Endocrine: Negative.   Genitourinary: Negative.   Musculoskeletal: Positive for back pain (chronic). Negative for neck pain.  Skin: Negative.   Allergic/Immunologic: Negative.   Neurological: Negative for dizziness and headaches.  Hematological: Negative for adenopathy. Does not bruise/bleed easily.  Psychiatric/Behavioral: Positive for sleep disturbance (napping often during the day). The patient is not nervous/anxious.        Objective:   Physical Exam  Constitutional: He is oriented to person, place, and time. He appears well-developed and well-nourished.  HENT:  Head: Normocephalic and atraumatic.  Eyes: Conjunctivae are normal. Pupils are equal, round, and reactive to light.  Neck: Normal range of motion. Neck supple.  Cardiovascular: Normal rate and regular rhythm.   Pulmonary/Chest: Effort normal and breath sounds normal.  Abdominal: Soft. He exhibits no distension. There is no tenderness.  Musculoskeletal: He exhibits edema. He exhibits no tenderness.  Neurological: He is alert and oriented to person, place, and time.  Skin: Skin is warm and dry.  Psychiatric: He has  a normal mood and affect. His behavior is normal. Thought content normal.  Nursing note and vitals reviewed.  BP 130/82 mmHg  Pulse 97  Resp 18  Ht 5\' 10"  (1.778 m)  Wt 561 lb (254.468 kg)  BMI 80.50 kg/m2  SpO2 89%         Assessment & Plan:  1: Chronic heart  failure with preserved ejection fraction- Patient presents with continued shortness of breath and fatigue with little exertion. He says that he was a little short of breath upon walking into the office today and he gets quite short of breath if he has to walk around [employer]. He does have oxygen at home but he usually only wears it at bedtime on 2L. Encouraged him to wear it while he's walking as his oxygen level gets low with exertion. Weight is down 3 pounds from last week and he's finished the zaroxolyn. Will check basic metabolic panel today. No further edema in his lower legs and his lungs are clear. He is not adding any salt to his food and is trying to stay away from high sodium foods.  2: Obesity hypoventilation syndrome- Patient was encouraged to take slow deep breaths instead of panting as he does in the office. When he took a few slow deep breaths, his oxygen level quickly rose from 83% to 89-90%. He has an appointment with the pulmonologist on 05/27/15 to be evaluated for a sleep study. Mom says that she hears him snore and then also hears him wake himself up with snorting. Is interested in hearing potential options regarding bariatric surgery. Will obtain that information for patient and call him with it.  3: Tachycardia- Patient's heart rate remains in the 90's even at rest. Did come down slightly upon him taking the slow deep breaths. Discussed possibly adding metoprolol to reduced his heart rate. Will see how it looks at his next visit.   Return in 1 month or sooner for any questions/problems before then.

## 2015-05-25 NOTE — Patient Instructions (Signed)
Continue to not add salt to your food and monitor sodium content carefully.   Keep your pulmonologist appointment.

## 2015-05-28 ENCOUNTER — Ambulatory Visit: Payer: BLUE CROSS/BLUE SHIELD | Admitting: Family

## 2015-06-02 ENCOUNTER — Ambulatory Visit: Payer: BLUE CROSS/BLUE SHIELD | Admitting: Cardiovascular Disease

## 2015-06-04 ENCOUNTER — Ambulatory Visit: Payer: Self-pay | Admitting: Physician Assistant

## 2015-06-18 ENCOUNTER — Encounter: Payer: Self-pay | Admitting: Physician Assistant

## 2015-06-18 ENCOUNTER — Ambulatory Visit (INDEPENDENT_AMBULATORY_CARE_PROVIDER_SITE_OTHER): Payer: BLUE CROSS/BLUE SHIELD | Admitting: Physician Assistant

## 2015-06-18 DIAGNOSIS — Z8739 Personal history of other diseases of the musculoskeletal system and connective tissue: Secondary | ICD-10-CM

## 2015-06-18 DIAGNOSIS — I5032 Chronic diastolic (congestive) heart failure: Secondary | ICD-10-CM

## 2015-06-18 DIAGNOSIS — E662 Morbid (severe) obesity with alveolar hypoventilation: Secondary | ICD-10-CM | POA: Diagnosis not present

## 2015-06-18 DIAGNOSIS — G4730 Sleep apnea, unspecified: Secondary | ICD-10-CM

## 2015-06-18 DIAGNOSIS — Z8639 Personal history of other endocrine, nutritional and metabolic disease: Secondary | ICD-10-CM

## 2015-06-18 MED ORDER — NALTREXONE-BUPROPION HCL ER 8-90 MG PO TB12: ORAL_TABLET | ORAL | Status: DC

## 2015-06-18 MED ORDER — ALLOPURINOL 100 MG PO TABS: 100.0000 mg | ORAL_TABLET | Freq: Every day | ORAL | Status: AC

## 2015-06-18 NOTE — Progress Notes (Signed)
Subjective:    Patient ID: Theodore Reid, male    DOB: [birth year], 29 y.o.   MRN: [id number]  HPI Arth Theodore Reid is a 29 year old male that comes to the office today to establish care. He is morbidly obese and is interested in weight loss. In May 2016 he was hospitalized at [facility] for hypoventilation and diagnosed with diastolic congestive heart failure. He has been followed by cardiology and is being medically managed with furosemide 20 mg 2 times daily. He also has obesity hypoventilation syndrome and is on Proventil as needed. He does follow-up with cardiology at the end of this month 06/24/2015.  He also has an upcoming appointment for a sleep study to see if he will need a CPAP and he also has an appointment with a pulmonologist for pulmonary function testing. During his hospital stay he was told by physicians that he would most likely need gastric bypass surgery to reduce his weight to prevent any further worsening symptoms of the heart failure or pulmonary hypertension. He is not interested in the bypass surgery at this time and would like to try to lose weight on his own with diet and exercise.   Review of Systems  Constitutional: Positive for fatigue. Negative for fever, diaphoresis, appetite change and unexpected weight change.  HENT: Negative.   Eyes: Negative.   Respiratory: Positive for apnea (sleep apnea) and shortness of breath. Negative for cough, choking, chest tightness, wheezing and stridor.   Cardiovascular: Positive for leg swelling (improved with lasix). Negative for chest pain and palpitations.  Gastrointestinal: Negative.   Endocrine: Negative.   Genitourinary: Negative.   Musculoskeletal: Positive for back pain. Negative for joint swelling, arthralgias, gait problem and neck pain.  Skin: Negative.   Allergic/Immunologic: Negative.   Neurological: Negative.   Hematological: Negative.   Psychiatric/Behavioral: Negative.        Objective:   Physical Exam   Constitutional: He is oriented to person, place, and time. He appears well-developed. He appears lethargic. He is cooperative.  Non-toxic appearance. He does not have a sickly appearance. No distress.  Morbidly obese  HENT:  Head: Normocephalic and atraumatic.  Cardiovascular: Normal rate, regular rhythm and normal heart sounds.  Exam reveals no gallop and no friction rub.   No murmur heard. Distant heart sounds due to body habitus  Pulmonary/Chest: Effort normal and breath sounds normal. No respiratory distress. He has no wheezes. He has no rales.  With walking he does have to use more physical effort for breathing.  Neurological: He is oriented to person, place, and time. He appears lethargic.  Psychiatric: He has a normal mood and affect. His behavior is normal. Judgment and thought content normal.  Vitals reviewed.         Assessment & Plan:  1. Morbidly obese We discussed weight loss for approximately 45 minutes. We discussed different diet options and exercise options that may be feasible for him at this time. I encouraged him to use a food diary so that he can evaluate how many calories he is in taking as well as be able to educate himself and learn portion sizes. I will refer him to [facility] for nutrition education and possibly exercise therapy. We will also try contrave as an appetite suppressant. Side effects were explained to the patient and advised him to not take with a high fat meal due to risk of seizure. He voiced understanding. Will have him return in one month for a weight recheck and  to see how his visits with cardiology, pulmonology, and the sleep study data. - Amb ref to Medical Nutrition Therapy-MNT - Naltrexone-Bupropion HCl ER 8-90 MG TB12; Take one tablet PO q a.m. X 1 week, one tab PO q a.m. Then one tab q p.m. On week 2, two tabs PO q a.m. And 1 tab q pm on week 3, then 2 tabs PO q am and 2 tabs q pm thereafter  Dispense:  112 tablet; Refill: 0  2. Chronic diastolic CHF (congestive heart failure) Newly diagnosed in May 2016 upon admission to [facility]. He is followed by cardiology for this.  3. Obesity hypoventilation syndrome Incident occurred in May 2016 with admission to [facility] due to hypoxemia. He has a follow-up appointment with pulmonology in the next coming weeks. We've also discussed weight loss and the benefits of weight loss. Will get him an appointment with the lifestyle Center and a nutrition education program and exercise program.  4. Sleep apnea Has sleep study already scheduled.  We will discuss the results of this when he returns in one month  5. H/O: gout He has had multiple flares of gout mostly occurring in the right ankle. He does take ibuprofen 800 mg when he has a flare. He would like to start allopurinol for control of the flares. - allopurinol (ZYLOPRIM) 100 MG tablet; Take 1 tablet (100 mg total) by mouth daily.  Dispense: 30 tablet; Refill: 6

## 2015-06-18 NOTE — Patient Instructions (Signed)
Exercise to Lose Weight Exercise and a healthy diet may help you lose weight. Your doctor may suggest specific exercises. EXERCISE IDEAS AND TIPS  Choose low-cost things you enjoy doing, such as walking, bicycling, or exercising to workout videos.  Take stairs instead of the elevator.  Walk during your lunch break.  Park your car further away from work or school.  Go to a gym or an exercise class.  Start with 5 to 10 minutes of exercise each day. Build up to 30 minutes of exercise 4 to 6 days a week.  Wear shoes with good support and comfortable clothes.  Stretch before and after working out.  Work out until you breathe harder and your heart beats faster.  Drink extra water when you exercise.  Do not do so much that you hurt yourself, feel dizzy, or get very short of breath. Exercises that burn about 150 calories:  Running 1  miles in 15 minutes.  Playing volleyball for 45 to 60 minutes.  Washing and waxing a car for 45 to 60 minutes.  Playing touch football for 45 minutes.  Walking 1  miles in 35 minutes.  Pushing a stroller 1  miles in 30 minutes.  Playing basketball for 30 minutes.  Raking leaves for 30 minutes.  Bicycling 5 miles in 30 minutes.  Walking 2 miles in 30 minutes.  Dancing for 30 minutes.  Shoveling snow for 15 minutes.  Swimming laps for 20 minutes.  Walking up stairs for 15 minutes.  Bicycling 4 miles in 15 minutes.  Gardening for 30 to 45 minutes.  Jumping rope for 15 minutes.  Washing windows or floors for 45 to 60 minutes. Document Released: 12/16/2010 Document Revised: 02/05/2012 Document Reviewed: 12/16/2010 Exit[employer] Patient Information 2015 [employer]. This information is not intended to replace advice given to you by your health care provider. Make sure you discuss any questions you have with your health care provider. Calorie Counting for Weight Loss Calories are energy you get from the things you eat and drink. Your  body uses this energy to keep you going throughout the day. The number of calories you eat affects your weight. When you eat more calories than your body needs, your body stores the extra calories as fat. When you eat fewer calories than your body needs, your body burns fat to get the energy it needs. Calorie counting means keeping track of how many calories you eat and drink each day. If you make sure to eat fewer calories than your body needs, you should lose weight. In order for calorie counting to work, you will need to eat the number of calories that are right for you in a day to lose a healthy amount of weight per week. A healthy amount of weight to lose per week is usually 1-2 lb (0.5-0.9 kg). A dietitian can determine how many calories you need in a day and give you suggestions on how to reach your calorie goal.  WHAT IS MY MY PLAN? My goal is to have __________ calories per day.  If I have this many calories per day, I should lose around __________ pounds per week. WHAT DO I NEED TO KNOW ABOUT CALORIE COUNTING? In order to meet your daily calorie goal, you will need to:  Find out how many calories are in each food you would like to eat. Try to do this before you eat.  Decide how much of the food you can eat.  Write down what you ate and   how many calories it had. Doing this is called keeping a food log. WHERE DO I FIND CALORIE INFORMATION? The number of calories in a food can be found on a Nutrition Facts label. Note that all the information on a label is based on a specific serving of the food. If a food does not have a Nutrition Facts label, try to look up the calories online or ask your dietitian for help. HOW DO I DECIDE HOW MUCH TO EAT? To decide how much of the food you can eat, you will need to consider both the number of calories in one serving and the size of one serving. This information can be found on the Nutrition Facts label. If a food does not have a Nutrition Facts label, look  up the information online or ask your dietitian for help. Remember that calories are listed per serving. If you choose to have more than one serving of a food, you will have to multiply the calories per serving by the amount of servings you plan to eat. For example, the label on a package of bread might say that a serving size is 1 slice and that there are 90 calories in a serving. If you eat 1 slice, you will have eaten 90 calories. If you eat 2 slices, you will have eaten 180 calories. HOW DO I KEEP A FOOD LOG? After each meal, record the following information in your food log:  What you ate.  How much of it you ate.  How many calories it had.  Then, add up your calories. Keep your food log near you, such as in a small notebook in your pocket. Another option is to use a mobile app or website. Some programs will calculate calories for you and show you how many calories you have left each time you add an item to the log. WHAT ARE SOME CALORIE COUNTING TIPS?  Use your calories on foods and drinks that will fill you up and not leave you hungry. Some examples of this include foods like nuts and nut butters, vegetables, lean proteins, and high-fiber foods (more than 5 g fiber per serving).  Eat nutritious foods and avoid empty calories. Empty calories are calories you get from foods or beverages that do not have many nutrients, such as candy and soda. It is better to have a nutritious high-calorie food (such as an avocado) than a food with few nutrients (such as a bag of chips).  Know how many calories are in the foods you eat most often. This way, you do not have to look up how many calories they have each time you eat them.  Look out for foods that may seem like low-calorie foods but are really high-calorie foods, such as baked goods, soda, and fat-free candy.  Pay attention to calories in drinks. Drinks such as sodas, specialty coffee drinks, alcohol, and juices have a lot of calories yet do  not fill you up. Choose low-calorie drinks like water and diet drinks.  Focus your calorie counting efforts on higher calorie items. Logging the calories in a garden salad that contains only vegetables is less important than calculating the calories in a milk shake.  Find a way of tracking calories that works for you. Get creative. Most people who are successful find ways to keep track of how much they eat in a day, even if they do not count every calorie. WHAT ARE SOME PORTION CONTROL TIPS?  Know how many calories are in a   serving. This will help you know how many servings of a certain food you can have.  Use a measuring cup to measure serving sizes. This is helpful when you start out. With time, you will be able to estimate serving sizes for some foods.  Take some time to put servings of different foods on your favorite plates, bowls, and cups so you know what a serving looks like.  Try not to eat straight from a bag or box. Doing this can lead to overeating. Put the amount you would like to eat in a cup or on a plate to make sure you are eating the right portion.  Use smaller plates, glasses, and bowls to prevent overeating. This is a quick and easy way to practice portion control. If your plate is smaller, less food can fit on it.  Try not to multitask while eating, such as watching TV or using your computer. If it is time to eat, sit down at a table and enjoy your food. Doing this will help you to start recognizing when you are full. It will also make you more aware of what and how much you are eating. HOW CAN I CALORIE COUNT WHEN EATING OUT?  Ask for smaller portion sizes or child-sized portions.  Consider sharing an entree and sides instead of getting your own entree.  If you get your own entree, eat only half. Ask for a box at the beginning of your meal and put the rest of your entree in it so you are not tempted to eat it.  Look for the calories on the menu. If calories are listed,  choose the lower calorie options.  Choose dishes that include vegetables, fruits, whole grains, low-fat dairy products, and lean protein. Focusing on smart food choices from each of the 5 food groups can help you stay on track at restaurants.  Choose items that are boiled, broiled, grilled, or steamed.  Choose water, milk, unsweetened iced tea, or other drinks without added sugars. If you want an alcoholic beverage, choose a lower calorie option. For example, a regular margarita can have up to 700 calories and a glass of wine has around 150.  Stay away from items that are buttered, battered, fried, or served with cream sauce. Items labeled "crispy" are usually fried, unless stated otherwise.  Ask for dressings, sauces, and syrups on the side. These are usually very high in calories, so do not eat much of them.  Watch out for salads. Many people think salads are a healthy option, but this is often not the case. Many salads come with bacon, fried chicken, lots of cheese, fried chips, and dressing. All of these items have a lot of calories. If you want a salad, choose a garden salad and ask for grilled meats or steak. Ask for the dressing on the side, or ask for olive oil and vinegar or lemon to use as dressing.  Estimate how many servings of a food you are given. For example, a serving of cooked rice is  cup or about the size of half a tennis ball or one cupcake wrapper. Knowing serving sizes will help you be aware of how much food you are eating at restaurants. The list below tells you how big or small some common portion sizes are based on everyday objects.  1 oz--4 stacked dice.  3 oz--1 deck of cards.  1 tsp--1 dice.  1 Tbsp-- a Ping-Pong ball.  2 Tbsp--1 Ping-Pong ball.   cup--1 tennis ball   or 1 cupcake wrapper.  1 cup--1 baseball. Document Released: 11/13/2005 Document Revised: 03/30/2014 Document Reviewed: 09/18/2013 [employer] Patient Information 2015 [employer]. This  information is not intended to replace advice given to you by your health care provider. Make sure you discuss any questions you have with your health care provider.  

## 2015-06-24 ENCOUNTER — Ambulatory Visit: Payer: BLUE CROSS/BLUE SHIELD | Admitting: Family

## 2015-06-24 ENCOUNTER — Encounter: Payer: Self-pay | Admitting: Family

## 2015-06-24 ENCOUNTER — Ambulatory Visit: Payer: BLUE CROSS/BLUE SHIELD | Attending: Family | Admitting: Family

## 2015-06-24 VITALS — BP 110/70 | HR 95 | Resp 20 | Ht 71.0 in | Wt >= 6400 oz

## 2015-06-24 DIAGNOSIS — Z79899 Other long term (current) drug therapy: Secondary | ICD-10-CM | POA: Insufficient documentation

## 2015-06-24 DIAGNOSIS — E662 Morbid (severe) obesity with alveolar hypoventilation: Secondary | ICD-10-CM | POA: Diagnosis not present

## 2015-06-24 DIAGNOSIS — Z87891 Personal history of nicotine dependence: Secondary | ICD-10-CM | POA: Diagnosis not present

## 2015-06-24 DIAGNOSIS — I5032 Chronic diastolic (congestive) heart failure: Secondary | ICD-10-CM

## 2015-06-24 DIAGNOSIS — G4730 Sleep apnea, unspecified: Secondary | ICD-10-CM

## 2015-06-24 DIAGNOSIS — M109 Gout, unspecified: Secondary | ICD-10-CM | POA: Diagnosis not present

## 2015-06-24 DIAGNOSIS — R0602 Shortness of breath: Secondary | ICD-10-CM | POA: Diagnosis present

## 2015-06-24 DIAGNOSIS — Z6841 Body Mass Index (BMI) 40.0 and over, adult: Secondary | ICD-10-CM | POA: Diagnosis not present

## 2015-06-24 NOTE — Progress Notes (Signed)
Subjective:    Patient ID: Theodore Reid, male    DOB: [birth year], 29 y.o.   MRN: [id number]  Congestive Heart Failure Presents for follow-up visit. The disease course has been stable. Associated symptoms include edema, fatigue and shortness of breath (with exertion or when rushing). Pertinent negatives include no abdominal pain, chest pain, chest pressure, orthopnea or palpitations. The symptoms have been stable. Past treatments include salt and fluid restriction. The treatment provided moderate relief. There is no history of arrhythmia or DM.  Other This is a recurrent (edema) problem. The current episode started more than 1 month ago. The problem occurs daily. The problem has been gradually worsening (have been out of fluid pill since yesterday). Associated symptoms include coughing (dry) and fatigue. Pertinent negatives include no abdominal pain, chest pain, congestion, headaches, neck pain or sore throat. The symptoms are aggravated by standing. He has tried position changes for the symptoms. The treatment provided mild relief.    Past Medical History  Diagnosis Date  . Morbid obesity   . Gout   . History of echocardiogram     a. a. echo 04/15/2015: EF 60-65%, no WMA, LA nl in size, RV poorly visualized, PASP nl, grossly nl study, challening study 2/2 body habitus   . CHF (congestive heart failure)     Past Surgical History  Procedure Laterality Date  . Tonsillectomy      Family History  Problem Relation Age of Onset  . Diabetes Mother   . CAD Mother   . Hypertension Mother   . Diabetes Father   . Cancer Father     rectal  . Diabetes Brother     History  Substance Use Topics  . Smoking status: Former Smoker -- 1.00 packs/day for 11 years    Types: Cigarettes    Quit date: 04/14/2015  . Smokeless tobacco: Never Used  . Alcohol Use: No    No Known Allergies  Prior to Admission medications   Medication Sig Start Date End Date Taking? Authorizing Provider   acetaminophen (TYLENOL) 325 MG tablet Take 650 mg by mouth every 6 (six) hours as needed for headache.   Yes Historical Provider, MD  albuterol (PROVENTIL HFA;VENTOLIN HFA) 108 (90 BASE) MCG/ACT inhaler Inhale 2 puffs into the lungs every 4 (four) hours as needed for wheezing or shortness of breath. 04/17/15  Yes Ramonita Lab, MD  allopurinol (ZYLOPRIM) 100 MG tablet Take 1 tablet (100 mg total) by mouth daily. 06/18/15  Yes Margaretann Loveless, PA-C  aspirin EC 81 MG tablet Take 1 tablet (81 mg total) by mouth daily. 04/17/15  Yes Ramonita Lab, MD  furosemide (LASIX) 20 MG tablet Take 1 tablet (20 mg total) by mouth 2 (two) times daily. 05/14/15  Yes Delma Freeze, FNP  ibuprofen (ADVIL,MOTRIN) 200 MG tablet Take 200 mg by mouth every 6 (six) hours as needed for headache or mild pain.   Yes Historical Provider, MD  Naltrexone-Bupropion HCl ER 8-90 MG TB12 Take one tablet PO q a.m. X 1 week, one tab PO q a.m. Then one tab q p.m. On week 2, two tabs PO q a.m. And 1 tab q pm on week 3, then 2 tabs PO q am and 2 tabs q pm thereafter 06/18/15  Yes Margaretann Loveless, PA-C     Review of Systems  Constitutional: Positive for fatigue. Negative for appetite change.  HENT: Negative for congestion, postnasal drip, sore throat and trouble swallowing.   Eyes: Negative.   Respiratory:  Positive for cough (dry) and shortness of breath (with exertion or when rushing). Negative for chest tightness and wheezing.   Cardiovascular: Positive for leg swelling. Negative for chest pain and palpitations.  Gastrointestinal: Negative for abdominal pain and abdominal distention.  Endocrine: Negative.   Genitourinary: Negative.   Musculoskeletal: Positive for back pain (chronic). Negative for neck pain.  Skin: Negative.   Allergic/Immunologic: Negative.   Neurological: Negative for dizziness, light-headedness and headaches.  Hematological: Negative for adenopathy. Does not bruise/bleed easily.  Psychiatric/Behavioral:  Negative for sleep disturbance (sleeping on 2 pillows) and dysphoric mood.       Objective:   Physical Exam  Constitutional: He is oriented to person, place, and time. He appears well-developed and well-nourished.  HENT:  Head: Normocephalic and atraumatic.  Eyes: Conjunctivae are normal. Pupils are equal, round, and reactive to light.  Neck: Normal range of motion. Neck supple.  Cardiovascular: Normal rate and regular rhythm.   Pulmonary/Chest: Effort normal and breath sounds normal. He has no wheezes. He has no rales.  Abdominal: Soft. He exhibits no distension. There is no tenderness.  Musculoskeletal: He exhibits edema (1-2+ pitting edema in bilateral lower legs). He exhibits no tenderness.  Neurological: He is alert and oriented to person, place, and time.  Skin: Skin is warm and dry.  Psychiatric: He has a normal mood and affect. His behavior is normal.  Nursing note and vitals reviewed.   BP 110/70 mmHg  Pulse 95  Resp 20  Ht 5\' 11"  (1.803 m)  Wt 568 lb (257.643 kg)  BMI 79.26 kg/m2  SpO2 88%       Assessment & Plan:  1: Chronic heart failure with preserved ejection fraction- Patient presents with fatigue and shortness of breath with exertion or when rushing. He also notices that he becomes short of breath when he's in hot weather. When he does become symptomatic he will stop to rest until his symptoms improve. He hasn't been weighing because he hasn't had a scale so a scale had been ordered for him that would weigh up to 700 pounds. This scale was given to him and he was encouraged to weigh daily and call for an overnight weight gain of >2 pounds or a weekly weight gain of >5 pounds. Patient commits to weighing daily. His weight is up 7 pounds from when he was here last but he says that he ran out of his diuretic yesterday but is going to pick it up today. Discussed the importance of not running out of his medications. Patient has been reading food labels for sodium content  and is closely trying to follow a 2000mg  sodium diet. Also encouraged him to monitor fluid intake so that he doesn't drink more than 2L of fluid daily.  2: Sleep apnea- Patient is scheduled for his sleep study on 07/07/15. 3: Obesity hypoventilation syndrome- Patient is scheduled to see the nutritionist on 07/09/15. Also discussed the importance of exercise and he says that he's going to do a free trial membership at a local gym with a trainer. He does not want to have gastric bypass at this time. His PCP has recently given him a weight loss supplement but he hasn't picked it up yet. Oxygen level was 88% upon walking into the office but after a few deep breaths, his oxygen level rose to 94%. Reminded him to take slow deep breaths after exertion.   Return in 1 month or sooner for any questions/problems before then.

## 2015-06-24 NOTE — Patient Instructions (Addendum)
Continue weighing daily and call for an overnight weight gain of > 2 pounds or a weekly weight gain of >5 pounds.  Continue to monitor sodium intake and follow a  sodium diet.

## 2015-06-30 MED ORDER — NALTREXONE-BUPROPION HCL ER 8-90 MG PO TB12: ORAL_TABLET | ORAL | Status: DC

## 2015-06-30 NOTE — Addendum Note (Signed)
Addended by: Margaretann Loveless on: 06/30/2015 04:06 PM   Modules accepted: Orders

## 2015-07-09 ENCOUNTER — Ambulatory Visit: Payer: BLUE CROSS/BLUE SHIELD | Admitting: Dietician

## 2015-07-16 ENCOUNTER — Ambulatory Visit: Payer: BLUE CROSS/BLUE SHIELD | Admitting: Physician Assistant

## 2015-08-03 ENCOUNTER — Ambulatory Visit: Payer: BLUE CROSS/BLUE SHIELD | Attending: Family | Admitting: Family

## 2015-08-03 ENCOUNTER — Encounter: Payer: Self-pay | Admitting: Family

## 2015-08-03 VITALS — BP 102/78 | HR 88 | Resp 20 | Ht 70.0 in | Wt >= 6400 oz

## 2015-08-03 DIAGNOSIS — E662 Morbid (severe) obesity with alveolar hypoventilation: Secondary | ICD-10-CM | POA: Insufficient documentation

## 2015-08-03 DIAGNOSIS — I5032 Chronic diastolic (congestive) heart failure: Secondary | ICD-10-CM | POA: Insufficient documentation

## 2015-08-03 DIAGNOSIS — Z6841 Body Mass Index (BMI) 40.0 and over, adult: Secondary | ICD-10-CM | POA: Insufficient documentation

## 2015-08-03 DIAGNOSIS — M109 Gout, unspecified: Secondary | ICD-10-CM | POA: Insufficient documentation

## 2015-08-03 DIAGNOSIS — G4730 Sleep apnea, unspecified: Secondary | ICD-10-CM | POA: Insufficient documentation

## 2015-08-03 DIAGNOSIS — Z87891 Personal history of nicotine dependence: Secondary | ICD-10-CM | POA: Insufficient documentation

## 2015-08-03 DIAGNOSIS — Z7982 Long term (current) use of aspirin: Secondary | ICD-10-CM | POA: Insufficient documentation

## 2015-08-03 DIAGNOSIS — Z79899 Other long term (current) drug therapy: Secondary | ICD-10-CM | POA: Insufficient documentation

## 2015-08-03 NOTE — Patient Instructions (Signed)
Continue weighing daily and call for an overnight weight gain of > 2 pounds or a weekly weight gain of >5 pounds.  Low-Sodium Eating Plan Sodium raises blood pressure and causes water to be held in the body. Getting less sodium from food will help lower your blood pressure, reduce any swelling, and protect your heart, liver, and kidneys. We get sodium by adding salt (sodium chloride) to food. Most of our sodium comes from canned, boxed, and frozen foods. Restaurant foods, fast foods, and pizza are also very high in sodium. Even if you take medicine to lower your blood pressure or to reduce fluid in your body, getting less sodium from your food is important. WHAT IS MY PLAN? Most people should limit their sodium intake to 2,300 mg a day. Your health care provider recommends that you limit your sodium intake to __2000mg__ a day.  WHAT DO I NEED TO KNOW ABOUT THIS EATING PLAN? For the low-sodium eating plan, you will follow these general guidelines:  Choose foods with a % Daily Value for sodium of less than 5% (as listed on the food label).   Use salt-free seasonings or herbs instead of table salt or sea salt.   Check with your health care provider or pharmacist before using salt substitutes.   Eat fresh foods.  Eat more vegetables and fruits.  Limit canned vegetables. If you do use them, rinse them well to decrease the sodium.   Limit cheese to 1 oz (28 g) per day.   Eat lower-sodium products, often labeled as "lower sodium" or "no salt added."  Avoid foods that contain monosodium glutamate (MSG). MSG is sometimes added to Chinese food and some canned foods.  Check food labels (Nutrition Facts labels) on foods to learn how much sodium is in one serving.  Eat more home-cooked food and less restaurant, buffet, and fast food.  When eating at a restaurant, ask that your food be prepared with less salt or none, if possible.  HOW DO I READ FOOD LABELS FOR SODIUM INFORMATION? The  Nutrition Facts label lists the amount of sodium in one serving of the food. If you eat more than one serving, you must multiply the listed amount of sodium by the number of servings. Food labels may also identify foods as:  Sodium free--Less than 5 mg in a serving.  Very low sodium--35 mg or less in a serving.  Low sodium--140 mg or less in a serving.  Light in sodium--50% less sodium in a serving. For example, if a food that usually has 300 mg of sodium is changed to become light in sodium, it will have 150 mg of sodium.  Reduced sodium--25% less sodium in a serving. For example, if a food that usually has 400 mg of sodium is changed to reduced sodium, it will have 300 mg of sodium. WHAT FOODS CAN I EAT? Grains Low-sodium cereals, including oats, puffed wheat and rice, and shredded wheat cereals. Low-sodium crackers. Unsalted rice and pasta. Lower-sodium bread.  Vegetables Frozen or fresh vegetables. Low-sodium or reduced-sodium canned vegetables. Low-sodium or reduced-sodium tomato sauce and paste. Low-sodium or reduced-sodium tomato and vegetable juices.  Fruits Fresh, frozen, and canned fruit. Fruit juice.  Meat and Other Protein Products Low-sodium canned tuna and salmon. Fresh or frozen meat, poultry, seafood, and fish. Lamb. Unsalted nuts. Dried beans, peas, and lentils without added salt. Unsalted canned beans. Homemade soups without salt. Eggs.  Dairy Milk. Soy milk. Ricotta cheese. Low-sodium or reduced-sodium cheeses. Yogurt.  Condiments Fresh   and dried herbs and spices. Salt-free seasonings. Onion and garlic powders. Low-sodium varieties of mustard and ketchup. Lemon juice.  Fats and Oils Reduced-sodium salad dressings. Unsalted butter.  Other Unsalted popcorn and pretzels.  The items listed above may not be a complete list of recommended foods or beverages. Contact your dietitian for more options. WHAT FOODS ARE NOT RECOMMENDED? Grains Instant hot cereals.  Bread stuffing, pancake, and biscuit mixes. Croutons. Seasoned rice or pasta mixes. Noodle soup cups. Boxed or frozen macaroni and cheese. Self-rising flour. Regular salted crackers. Vegetables Regular canned vegetables. Regular canned tomato sauce and paste. Regular tomato and vegetable juices. Frozen vegetables in sauces. Salted french fries. Olives. Pickles. Relishes. Sauerkraut. Salsa. Meat and Other Protein Products Salted, canned, smoked, spiced, or pickled meats, seafood, or fish. Bacon, ham, sausage, hot dogs, corned beef, chipped beef, and packaged luncheon meats. Salt pork. Jerky. Pickled herring. Anchovies, regular canned tuna, and sardines. Salted nuts. Dairy Processed cheese and cheese spreads. Cheese curds. Blue cheese and cottage cheese. Buttermilk.  Condiments Onion and garlic salt, seasoned salt, table salt, and sea salt. Canned and packaged gravies. Worcestershire sauce. Tartar sauce. Barbecue sauce. Teriyaki sauce. Soy sauce, including reduced sodium. Steak sauce. Fish sauce. Oyster sauce. Cocktail sauce. Horseradish. Regular ketchup and mustard. Meat flavorings and tenderizers. Bouillon cubes. Hot sauce. Tabasco sauce. Marinades. Taco seasonings. Relishes. Fats and Oils Regular salad dressings. Salted butter. Margarine. Ghee. Bacon fat.  Other Potato and tortilla chips. Corn chips and puffs. Salted popcorn and pretzels. Canned or dried soups. Pizza. Frozen entrees and pot pies.  The items listed above may not be a complete list of foods and beverages to avoid. Contact your dietitian for more information. Document Released: 05/05/2002 Document Revised: 11/18/2013 Document Reviewed: 09/17/2013 [employer] Patient Information 2015 [employer]. This information is not intended to replace advice given to you by your health care provider. Make sure you discuss any questions you have with your health care provider.  

## 2015-08-03 NOTE — Progress Notes (Signed)
Subjective:    Patient ID: Theodore Reid, male    DOB: 07/30/1986, 29 y.o.   MRN: [id number]  Congestive Heart Failure Presents for follow-up visit. The disease course has been worsening. Associated symptoms include edema and fatigue. Pertinent negatives include no abdominal pain, chest pain, chest pressure, orthopnea, palpitations or shortness of breath. The symptoms have been worsening. Past treatments include oxygen and salt and fluid restriction. The treatment provided moderate relief. Compliance with prior treatments has been variable. Prior compliance problems include medication issues (difficulty remembering to take diuretic). His past medical history is significant for chronic lung disease. There is no history of CVA or HTN. Compliance with total regimen is 51-75%. Compliance problems include adherence to diet and adherence to exercise.   Other This is a chronic (edema) problem. The current episode started more than 1 month ago. The problem occurs constantly. The problem has been gradually worsening. Associated symptoms include coughing (little bit) and fatigue. Pertinent negatives include no abdominal pain, arthralgias, chest pain, congestion, headaches, myalgias, nausea, numbness or sore throat.    Past Medical History  Diagnosis Date  . Morbid obesity   . Gout   . History of echocardiogram     a. a. echo 04/15/2015: EF 60-65%, no WMA, LA nl in size, RV poorly visualized, PASP nl, grossly nl study, challening study 2/2 body habitus   . CHF (congestive heart failure)     Past Surgical History  Procedure Laterality Date  . Tonsillectomy      Family History  Problem Relation Age of Onset  . Diabetes Mother   . CAD Mother   . Hypertension Mother   . Diabetes Father   . Cancer Father     rectal  . Diabetes Brother     Social History  Substance Use Topics  . Smoking status: Former Smoker -- 1.00 packs/day for 11 years    Types: Cigarettes    Quit date: 04/14/2015  .  Smokeless tobacco: Never Used  . Alcohol Use: No    No Known Allergies  Prior to Admission medications   Medication Sig Start Date End Date Taking? Authorizing Provider  acetaminophen (TYLENOL) 325 MG tablet Take 650 mg by mouth every 6 (six) hours as needed for headache.   Yes Historical Provider, MD  albuterol (PROVENTIL HFA;VENTOLIN HFA) 108 (90 BASE) MCG/ACT inhaler Inhale 2 puffs into the lungs every 4 (four) hours as needed for wheezing or shortness of breath. 04/17/15  Yes Ramonita Lab, MD  allopurinol (ZYLOPRIM) 100 MG tablet Take 1 tablet (100 mg total) by mouth daily. 06/18/15  Yes Margaretann Loveless, PA-C  aspirin EC 81 MG tablet Take 1 tablet (81 mg total) by mouth daily. 04/17/15  Yes Ramonita Lab, MD  furosemide (LASIX) 20 MG tablet Take 1 tablet (20 mg total) by mouth 2 (two) times daily. 05/14/15  Yes Delma Freeze, FNP  ibuprofen (ADVIL,MOTRIN) 200 MG tablet Take 200 mg by mouth every 6 (six) hours as needed for headache or mild pain.   Yes Historical Provider, MD  Naltrexone-Bupropion HCl ER 8-90 MG TB12 Take 1 tablet PO q a.m. X 1 week. Then 1 tab PO BID x 1 week, then 2 tabs PO q am and 1 tab PO q pm x 1 week, then 2 tabs PO BID. Patient not taking: Reported on 08/03/2015 06/30/15   Margaretann Loveless, PA-C     Review of Systems  Constitutional: Positive for fatigue. Negative for appetite change.  HENT: Negative for  congestion, postnasal drip and sore throat.   Eyes: Negative.   Respiratory: Positive for cough (little bit). Negative for chest tightness and shortness of breath.   Cardiovascular: Positive for leg swelling. Negative for chest pain and palpitations.  Gastrointestinal: Negative for nausea, abdominal pain and abdominal distention.  Endocrine: Negative.   Genitourinary: Negative.   Musculoskeletal: Negative for myalgias, back pain and arthralgias.  Skin: Negative.   Allergic/Immunologic: Negative.   Neurological: Negative for dizziness, light-headedness, numbness  and headaches.  Hematological: Negative for adenopathy. Does not bruise/bleed easily.  Psychiatric/Behavioral: Positive for sleep disturbance (sleepingon 2 pillows). Negative for dysphoric mood. The patient is not nervous/anxious.        Objective:   Physical Exam  Constitutional: He is oriented to person, place, and time. He appears well-developed and well-nourished.  HENT:  Head: Normocephalic and atraumatic.  Eyes: Conjunctivae are normal. Pupils are equal, round, and reactive to light.  Neck: Normal range of motion. Neck supple.  Cardiovascular: Normal rate and regular rhythm.   Pulmonary/Chest: Effort normal. He has no wheezes. He has no rales.  Abdominal: Soft. He exhibits no distension. There is no tenderness.  Musculoskeletal: He exhibits edema (2-3+ pitting edema in bilateral lower legs with L>R). He exhibits no tenderness.  Neurological: He is alert and oriented to person, place, and time.  Skin: Skin is warm and dry.  Psychiatric: He has a normal mood and affect. His behavior is normal. Thought content normal.  Nursing note and vitals reviewed.   BP 102/78 mmHg  Pulse 88  Resp 20  Ht  (1.778 m)  Wt 593 lb (268.983 kg)  BMI 85.09 kg/m2  SpO2 93%       Assessment & Plan:  1: Chronic heart failure with preserved ejection fraction- Patient presents with fatigue upon exertion which does improve after resting for a few minutes. Denies any shortness of breath even upon walking into the office. Does wear his oxygen around the clock at 2L. Has been weighing himself at home and says that he's noticed a gradual weight gain. He also says that he's had an overnight weight gain of >2 pounds but just didn't call the office. Encouraged him to call anytime he has an overnight weight gain of >2 pounds or a weekly weight gain of >5 pounds. He has gained 25 pounds since he was here last on 06/25/15. He does admit to not taking his diuretic consistently because he forgets. He says that  he usually takes it in the mornings but then often forgets to take the 2nd dose and occasionally forgets it completely. Discussed the importance of figuring out a system that works for him to remember to take it twice daily especially with the edema that he has in both of his lower legs. He does say that he elevates his legs at home. Does not add salt to his food. Is keeping his fluid intake <2 L daily.  2: Sleep apnea- He says that he's had his sleep study done at Dr. Milta Deiters office and has a followup appointment on 08/12/15 to discuss the results. 3: Obesity hypoventilation syndrome- Patient has had pulmonary function tests done and is awaiting results. Discussed the importance of increasing his activity and monitoring his diet closely. Discussed how his heart can not withstand a consistently increasing amount of weight. Encouraged him to increase his activity slowly but consistently.   Return in 2 weeks for a recheck of his edema or sooner for any questions/problems before then.

## 2015-08-17 ENCOUNTER — Ambulatory Visit: Payer: BLUE CROSS/BLUE SHIELD | Admitting: Family

## 2015-08-20 ENCOUNTER — Ambulatory Visit: Payer: BLUE CROSS/BLUE SHIELD | Attending: Family | Admitting: Family

## 2015-08-20 ENCOUNTER — Encounter: Payer: Self-pay | Admitting: Family

## 2015-08-20 VITALS — BP 120/80 | HR 80 | Resp 20 | Ht 71.0 in | Wt >= 6400 oz

## 2015-08-20 DIAGNOSIS — R0602 Shortness of breath: Secondary | ICD-10-CM | POA: Insufficient documentation

## 2015-08-20 DIAGNOSIS — I5032 Chronic diastolic (congestive) heart failure: Secondary | ICD-10-CM | POA: Insufficient documentation

## 2015-08-20 DIAGNOSIS — Z6841 Body Mass Index (BMI) 40.0 and over, adult: Secondary | ICD-10-CM | POA: Insufficient documentation

## 2015-08-20 DIAGNOSIS — Z7982 Long term (current) use of aspirin: Secondary | ICD-10-CM | POA: Insufficient documentation

## 2015-08-20 DIAGNOSIS — Z87891 Personal history of nicotine dependence: Secondary | ICD-10-CM | POA: Insufficient documentation

## 2015-08-20 DIAGNOSIS — Z79899 Other long term (current) drug therapy: Secondary | ICD-10-CM | POA: Insufficient documentation

## 2015-08-20 DIAGNOSIS — E662 Morbid (severe) obesity with alveolar hypoventilation: Secondary | ICD-10-CM | POA: Insufficient documentation

## 2015-08-20 DIAGNOSIS — G4730 Sleep apnea, unspecified: Secondary | ICD-10-CM | POA: Insufficient documentation

## 2015-08-20 DIAGNOSIS — M109 Gout, unspecified: Secondary | ICD-10-CM | POA: Insufficient documentation

## 2015-08-20 NOTE — Patient Instructions (Signed)
Continue weighing daily and call for an overnight weight gain of > 2 pounds or a weekly weight gain of >5 pounds.  Pulmonary rehab referral made for exercise.    Low-Sodium Eating Plan Sodium raises blood pressure and causes water to be held in the body. Getting less sodium from food will help lower your blood pressure, reduce any swelling, and protect your heart, liver, and kidneys. We get sodium by adding salt (sodium chloride) to food. Most of our sodium comes from canned, boxed, and frozen foods. Restaurant foods, fast foods, and pizza are also very high in sodium. Even if you take medicine to lower your blood pressure or to reduce fluid in your body, getting less sodium from your food is important. WHAT IS MY PLAN? Most people should limit their sodium intake to 2,300 mg a day. Your health care provider recommends that you limit your sodium intake to _2000mg _ a day.  WHAT DO I NEED TO KNOW ABOUT THIS EATING PLAN? For the low-sodium eating plan, you will follow these general guidelines:  Choose foods with a % Daily Value for sodium of less than 5% (as listed on the food label).   Use salt-free seasonings or herbs instead of table salt or sea salt.   Check with your health care provider or pharmacist before using salt substitutes.   Eat fresh foods.  Eat more vegetables and fruits.  Limit canned vegetables. If you do use them, rinse them well to decrease the sodium.   Limit cheese to 1 oz (28 g) per day.   Eat lower-sodium products, often labeled as "lower sodium" or "no salt added."  Avoid foods that contain monosodium glutamate (MSG). MSG is sometimes added to [place] food and some canned foods.  Check food labels (Nutrition Facts labels) on foods to learn how much sodium is in one serving.  Eat more home-cooked food and less restaurant, buffet, and fast food.  When eating at a restaurant, ask that your food be prepared with less salt or none, if possible.  HOW DO I  READ FOOD LABELS FOR SODIUM INFORMATION? The Nutrition Facts label lists the amount of sodium in one serving of the food. If you eat more than one serving, you must multiply the listed amount of sodium by the number of servings. Food labels may also identify foods as:  Sodium free--Less than 5 mg in a serving.  Very low sodium--35 mg or less in a serving.  Low sodium--140 mg or less in a serving.  Light in sodium--50% less sodium in a serving. For example, if a food that usually has 300 mg of sodium is changed to become light in sodium, it will have 150 mg of sodium.  Reduced sodium--25% less sodium in a serving. For example, if a food that usually has 400 mg of sodium is changed to reduced sodium, it will have 300 mg of sodium. WHAT FOODS CAN I EAT? Grains Low-sodium cereals, including oats, puffed wheat and rice, and shredded wheat cereals. Low-sodium crackers. Unsalted rice and pasta. Lower-sodium bread.  Vegetables Frozen or fresh vegetables. Low-sodium or reduced-sodium canned vegetables. Low-sodium or reduced-sodium tomato sauce and paste. Low-sodium or reduced-sodium tomato and vegetable juices.  Fruits Fresh, frozen, and canned fruit. Fruit juice.  Meat and Other Protein Products Low-sodium canned tuna and salmon. Fresh or frozen meat, poultry, seafood, and fish. Lamb. Unsalted nuts. Dried beans, peas, and lentils without added salt. Unsalted canned beans. Homemade soups without salt. Eggs.  Dairy Milk. Soy milk. Ricotta  cheese. Low-sodium or reduced-sodium cheeses. Yogurt.  Condiments Fresh and dried herbs and spices. Salt-free seasonings. Onion and garlic powders. Low-sodium varieties of mustard and ketchup. Lemon juice.  Fats and Oils Reduced-sodium salad dressings. Unsalted butter.  Other Unsalted popcorn and pretzels.  The items listed above may not be a complete list of recommended foods or beverages. Contact your dietitian for more options. WHAT FOODS ARE NOT  RECOMMENDED? Grains Instant hot cereals. Bread stuffing, pancake, and biscuit mixes. Croutons. Seasoned rice or pasta mixes. Noodle soup cups. Boxed or frozen macaroni and cheese. Self-rising flour. Regular salted crackers. Vegetables Regular canned vegetables. Regular canned tomato sauce and paste. Regular tomato and vegetable juices. Frozen vegetables in sauces. Salted french fries. Olives. Theodore Reid. Relishes. Sauerkraut. Salsa. Meat and Other Protein Products Salted, canned, smoked, spiced, or pickled meats, seafood, or fish. Bacon, ham, sausage, hot dogs, corned beef, chipped beef, and packaged luncheon meats. Salt pork. Jerky. Pickled herring. Anchovies, regular canned tuna, and sardines. Salted nuts. Dairy Processed cheese and cheese spreads. Cheese curds. Blue cheese and cottage cheese. Buttermilk.  Condiments Onion and garlic salt, seasoned salt, table salt, and sea salt. Canned and packaged gravies. Worcestershire sauce. Tartar sauce. Barbecue sauce. Teriyaki sauce. Soy sauce, including reduced sodium. Steak sauce. Fish sauce. Oyster sauce. Cocktail sauce. Horseradish. Regular ketchup and mustard. Meat flavorings and tenderizers. Bouillon cubes. Hot sauce. Tabasco sauce. Marinades. Taco seasonings. Relishes. Fats and Oils Regular salad dressings. Salted butter. Margarine. Ghee. Bacon fat.  Other Potato and tortilla chips. Corn chips and puffs. Salted popcorn and pretzels. Canned or dried soups. Pizza. Frozen entrees and pot pies.  The items listed above may not be a complete list of foods and beverages to avoid. Contact your dietitian for more information. Document Released: 05/05/2002 Document Revised: 11/18/2013 Document Reviewed: 09/17/2013 [facility] Patient Information 2015 [place], [place]. This information is not intended to replace advice given to you by your health care provider. Make sure you discuss any questions you have with your health care provider.

## 2015-08-20 NOTE — Progress Notes (Signed)
Subjective:    Patient ID: Theodore Reid, male    DOB: 1985/12/02, 29 y.o.   MRN: [id number]  Congestive Heart Failure Presents for follow-up visit. The disease course has been improving. Associated symptoms include edema, fatigue and shortness of breath (improving). Pertinent negatives include no abdominal pain, chest pain, chest pressure, orthopnea, palpitations or unexpected weight change. The symptoms have been improving. Past treatments include salt and fluid restriction, oxygen and exercise. The treatment provided moderate relief. Compliance with prior treatments has been variable. There is no history of CAD, DM or DVT. Compliance with total regimen is 76-100%. Compliance problems include adherence to exercise.   Other This is a chronic (fatigue) problem. The current episode started more than 1 month ago. The problem occurs daily. The problem has been unchanged. Associated symptoms include coughing ("little bit") and fatigue. Pertinent negatives include no abdominal pain, chest pain, congestion, fever, headaches, neck pain, sore throat, vertigo or weakness. The symptoms are aggravated by walking and exertion. He has tried rest for the symptoms. The treatment provided moderate relief.   Past Medical History  Diagnosis Date  . Morbid obesity   . Gout   . History of echocardiogram     a. a. echo 04/15/2015: EF 60-65%, no WMA, LA nl in size, RV poorly visualized, PASP nl, grossly nl study, challening study 2/2 body habitus   . CHF (congestive heart failure)     Past Surgical History  Procedure Laterality Date  . Tonsillectomy      Family History  Problem Relation Age of Onset  . Diabetes Mother   . CAD Mother   . Hypertension Mother   . Diabetes Father   . Cancer Father     rectal  . Diabetes Brother     Social History  Substance Use Topics  . Smoking status: Former Smoker -- 1.00 packs/day for 11 years    Types: Cigarettes    Quit date: 04/14/2015  . Smokeless tobacco:  Never Used  . Alcohol Use: No    No Known Allergies  Prior to Admission medications   Medication Sig Start Date End Date Taking? Authorizing Provider  acetaminophen (TYLENOL) 325 MG tablet Take 650 mg by mouth every 6 (six) hours as needed for headache.   Yes Historical Provider, MD  albuterol (PROVENTIL HFA;VENTOLIN HFA) 108 (90 BASE) MCG/ACT inhaler Inhale 2 puffs into the lungs every 4 (four) hours as needed for wheezing or shortness of breath. 04/17/15  Yes Ramonita Lab, MD  allopurinol (ZYLOPRIM) 100 MG tablet Take 1 tablet (100 mg total) by mouth daily. 06/18/15  Yes Margaretann Loveless, PA-C  aspirin EC 81 MG tablet Take 1 tablet (81 mg total) by mouth daily. 04/17/15  Yes Ramonita Lab, MD  furosemide (LASIX) 20 MG tablet Take 1 tablet (20 mg total) by mouth 2 (two) times daily. 05/14/15  Yes Delma Freeze, FNP  ibuprofen (ADVIL,MOTRIN) 200 MG tablet Take 200 mg by mouth every 6 (six) hours as needed for headache or mild pain.   Yes Historical Provider, MD      Review of Systems  Constitutional: Positive for fatigue. Negative for fever, appetite change and unexpected weight change.  HENT: Negative for congestion, postnasal drip and sore throat.   Eyes: Negative.   Respiratory: Positive for cough ("little bit") and shortness of breath (improving). Negative for chest tightness.   Cardiovascular: Positive for leg swelling. Negative for chest pain and palpitations.  Gastrointestinal: Negative for abdominal pain and abdominal distention.  Endocrine:  Negative.   Genitourinary: Negative.   Musculoskeletal: Negative for back pain and neck pain.  Skin: Negative.   Allergic/Immunologic: Negative.   Neurological: Negative for dizziness, vertigo, weakness, light-headedness and headaches.  Hematological: Negative for adenopathy. Does not bruise/bleed easily.  Psychiatric/Behavioral: Positive for sleep disturbance (sleeping in recliner due to comfort). Negative for dysphoric mood. The patient is  not nervous/anxious.        Objective:   Physical Exam  Constitutional: He is oriented to person, place, and time. He appears well-developed and well-nourished.  HENT:  Head: Normocephalic and atraumatic.  Eyes: Conjunctivae are normal. Pupils are equal, round, and reactive to light.  Neck: Normal range of motion. Neck supple.  Cardiovascular: Normal rate and regular rhythm.   Pulmonary/Chest: Effort normal. He has no wheezes. He has no rales.  Abdominal: Soft. He exhibits no distension. There is no tenderness.  Musculoskeletal: He exhibits edema (1+ bilateral edema in lower extremities). He exhibits no tenderness.  Neurological: He is alert and oriented to person, place, and time.  Skin: Skin is warm and dry.  Psychiatric: He has a normal mood and affect. His behavior is normal. Thought content normal.  Nursing note and vitals reviewed.  BP 120/80 mmHg  Pulse 80  Resp 20  Ht  (1.803 m)  Wt 589 lb (267.169 kg)  BMI 82.19 kg/m2  SpO2 90%        Assessment & Plan:  1: Chronic heart failure with preserved ejection fraction-  Patient presents with fatigue and shortness of breath upon exertion but he feels like neither symptom is as bad as they once were. He denies feeling short of breath upon walking into the office today. He has been weighing himself and has noticed a slight weight loss. By our scale, he's lost 4 pounds since he was here last. Reminded to call for an overnight weight gain of >2 pounds or a weekly weight gain of >5 pounds. He says that he's now consistently taking his medication as prescribed. Discussed using a pillbox as an additional way of remembering to take the medication. He is not adding salt to his food and is trying to make low sodium choices. Mom says that most of their food is either baked or broiled.  2: Sleep apnea- He's had the 1st sleep study done and has followed up with the pulmonologist. Has to return in a couple of weeks for the titration sleep  study portion. Discussed the importance of getting his sleep apnea treated. Patient says that he wants to start at [facility] or start walking since it's not as hot. Encouraged him to start walking and discussed pulmonary rehab with the patient and both he and his mom are interested. Referral placed today within [facility] for [facility] and a brochure was also given to the patient. 3: Obesity hypoventilation syndrome- Patient does wear oxygen at 2L as needed and sometimes at bedtime. Patient admits to having difficulty walking much or being out in the heat.   Return in 2 months or sooner for any questions/problems before the next office visit.

## 2015-10-19 ENCOUNTER — Ambulatory Visit: Payer: Medicaid Other | Attending: Family | Admitting: Family

## 2015-10-19 ENCOUNTER — Encounter: Payer: Self-pay | Admitting: Family

## 2015-10-19 VITALS — BP 132/94 | HR 106 | Resp 20 | Ht 71.0 in | Wt >= 6400 oz

## 2015-10-19 DIAGNOSIS — Z87891 Personal history of nicotine dependence: Secondary | ICD-10-CM | POA: Diagnosis not present

## 2015-10-19 DIAGNOSIS — I10 Essential (primary) hypertension: Secondary | ICD-10-CM | POA: Insufficient documentation

## 2015-10-19 DIAGNOSIS — M109 Gout, unspecified: Secondary | ICD-10-CM | POA: Diagnosis not present

## 2015-10-19 DIAGNOSIS — E662 Morbid (severe) obesity with alveolar hypoventilation: Secondary | ICD-10-CM

## 2015-10-19 DIAGNOSIS — Z7982 Long term (current) use of aspirin: Secondary | ICD-10-CM | POA: Diagnosis not present

## 2015-10-19 DIAGNOSIS — R000 Tachycardia, unspecified: Secondary | ICD-10-CM | POA: Diagnosis not present

## 2015-10-19 DIAGNOSIS — I5033 Acute on chronic diastolic (congestive) heart failure: Secondary | ICD-10-CM | POA: Insufficient documentation

## 2015-10-19 DIAGNOSIS — Z79899 Other long term (current) drug therapy: Secondary | ICD-10-CM | POA: Diagnosis not present

## 2015-10-19 DIAGNOSIS — Z6841 Body Mass Index (BMI) 40.0 and over, adult: Secondary | ICD-10-CM | POA: Diagnosis not present

## 2015-10-19 MED ORDER — METOLAZONE 5 MG PO TABS: 5.0000 mg | ORAL_TABLET | Freq: Every day | ORAL | Status: DC

## 2015-10-19 NOTE — Patient Instructions (Addendum)
Continue weighing daily and call for an overnight weight gain of > 2 pounds or a weekly weight gain of >5 pounds.  Decrease fluid intake to 1L daily.  Take zaroxolyn (metolazone) once daily for the next 4 days preferable 1/2 hour prior to lasix (furosemide).  Do NOT add salt to your food.   Will check labs next week.

## 2015-10-19 NOTE — Progress Notes (Signed)
Subjective:    Patient ID: Theodore Reid, male    DOB: [birth year], 29 y.o.   MRN: [id number]  Congestive Heart Failure Presents for follow-up visit. The disease course has been worsening. Associated symptoms include chest pain, edema, fatigue, shortness of breath and unexpected weight change. Pertinent negatives include no abdominal pain, orthopnea or palpitations. The symptoms have been worsening. Past treatments include salt and fluid restriction and oxygen. The treatment provided no relief. Compliance with prior treatments has been variable. Prior compliance problems include difficulty with treatment plan. There is no history of CVA. He has one 1st degree relative with heart disease. Compliance with total regimen is 76-100%. Compliance problems include adherence to exercise and adherence to diet.   Chest Pain  This is a new problem. The current episode started yesterday. The onset quality is sudden. The problem occurs rarely. The problem has been resolved (after a few minutes). The pain is present in the substernal region. The pain is at a severity of 1/10. The pain is mild. The quality of the pain is described as dull. The pain does not radiate. Associated symptoms include a cough (white sputum), lower extremity edema and shortness of breath. Pertinent negatives include no abdominal pain, back pain, diaphoresis, dizziness, headaches, irregular heartbeat, nausea, numbness, palpitations, syncope or vomiting. The pain is aggravated by nothing. He has tried rest for the symptoms. The treatment provided significant relief. Risk factors include male gender, lack of exercise, obesity and sedentary lifestyle.  His past medical history is significant for CHF.  Pertinent negatives for past medical history include no hypertension and no MI.  His family medical history is significant for diabetes, heart disease and hypertension.  Cough This is a recurrent problem. The current episode started in the past 7  days. The problem has been unchanged. The problem occurs every few hours. The cough is productive of sputum. Associated symptoms include chest pain and shortness of breath. Pertinent negatives include no headaches, nasal congestion, postnasal drip, sore throat or wheezing. Nothing aggravates the symptoms. He has tried nothing for the symptoms. There is no history of asthma, emphysema or pneumonia.    Past Medical History  Diagnosis Date  . Morbid obesity (HCC)   . Gout   . History of echocardiogram     a. a. echo 04/15/2015: EF 60-65%, no WMA, LA nl in size, RV poorly visualized, PASP nl, grossly nl study, challening study 2/2 body habitus   . CHF (congestive heart failure) [facility](HCC)     Past Surgical History  Procedure Laterality Date  . Tonsillectomy      Family History  Problem Relation Age of Onset  . Diabetes Mother   . CAD Mother   . Hypertension Mother   . Diabetes Father   . Cancer Father     rectal  . Diabetes Brother     Social History  Substance Use Topics  . Smoking status: Former Smoker -- 1.00 packs/day for 11 years    Types: Cigarettes    Quit date: 04/14/2015  . Smokeless tobacco: Never Used  . Alcohol Use: No    No Known Allergies  Prior to Admission medications   Medication Sig Start Date End Date Taking? Authorizing Provider  acetaminophen (TYLENOL) 325 MG tablet Take 650 mg by mouth every 6 (six) hours as needed for headache.   Yes Historical Provider, MD  albuterol (PROVENTIL HFA;VENTOLIN HFA) 108 (90 BASE) MCG/ACT inhaler Inhale 2 puffs into the lungs every 4 (four) hours as needed for  wheezing or shortness of breath. 04/17/15  Yes Ramonita Lab, MD  allopurinol (ZYLOPRIM) 100 MG tablet Take 1 tablet (100 mg total) by mouth daily. 06/18/15  Yes Margaretann Loveless, PA-C  aspirin EC 81 MG tablet Take 1 tablet (81 mg total) by mouth daily. 04/17/15  Yes Ramonita Lab, MD  furosemide (LASIX) 20 MG tablet Take 1 tablet (20 mg total) by mouth 2 (two) times daily.  05/14/15  Yes Delma Freeze, FNP  metolazone (ZAROXOLYN) 5 MG tablet Take 1 tablet (5 mg total) by mouth daily. 10/19/15   Delma Freeze, FNP     Review of Systems  Constitutional: Positive for fatigue and unexpected weight change. Negative for diaphoresis and appetite change.  HENT: Negative for congestion, postnasal drip and sore throat.   Eyes: Negative.   Respiratory: Positive for cough (white sputum) and shortness of breath. Negative for chest tightness and wheezing.   Cardiovascular: Positive for chest pain and leg swelling. Negative for palpitations and syncope.  Gastrointestinal: Positive for abdominal distention. Negative for nausea, vomiting and abdominal pain.  Endocrine: Negative.   Genitourinary: Negative.   Musculoskeletal: Negative for back pain and arthralgias.  Skin: Negative.   Allergic/Immunologic: Negative.   Neurological: Negative for dizziness, light-headedness, numbness and headaches.  Hematological: Negative for adenopathy. Does not bruise/bleed easily.  Psychiatric/Behavioral: Negative for sleep disturbance (in recliner or in bed with 2 pillows. oxygen at 3L during the day. ) and dysphoric mood. The patient is not nervous/anxious.        Objective:   Physical Exam  Constitutional: He is oriented to person, place, and time. He appears well-developed and well-nourished.  HENT:  Head: Normocephalic and atraumatic.  Eyes: Conjunctivae are normal. Pupils are equal, round, and reactive to light.  Neck: Normal range of motion. Neck supple.  Cardiovascular: Regular rhythm.  Tachycardia present.   Pulmonary/Chest: Effort normal. He has no wheezes. He has no rales.  Abdominal: He exhibits distension. There is no tenderness.  Musculoskeletal: He exhibits edema (2+ pitting edema in bilateral lower legs). He exhibits no tenderness.  Neurological: He is alert and oriented to person, place, and time.  Skin: Skin is warm and dry.  Psychiatric: He has a normal mood and  affect. His behavior is normal. Thought content normal.  Nursing note and vitals reviewed.  BP 132/94 mmHg  Pulse 106  Resp 20  Ht  (1.803 m)  Wt 628 lb (284.859 kg)  BMI 87.63 kg/m2  SpO2 86%        Assessment & Plan:  1: Acute on chronic heart failure with preserved ejection fraction- Patient presents with abdominal swelling/tightness, increased swelling in his legs, worsening fatigue and increase weight gain. He says that symptoms began last week and his home weight was up to 609. He admits to not weighing daily and it was explained to him that he must weigh daily and call for an overnight weight gain of >2 pounds or a weekly weight gain of >5 pounds. By our scale, his weight is 39 pounds higher than his last visit in September 2016. He also says that he's been drinking about 2L+ of fluids daily. Explained to him that he can drink too much fluids and he needs to decrease his fluid intake to 1L of all fluids daily. He also says that he's been adding salt to his food and he was told to not add any salt to his food. Will add metolazone  daily for the next 4 days. He also  takes ibuprofen on occasion and he was told to use tylenol instead if needed.  2: HTN- Blood pressure elevated some today but most likely it's due to the extra fluid. 3: Tachycardia- Heart rate elevated from previous visit. Hopefully it'll come down once he loses some of this fluid.  4: Obesity Hypoventilation syndrome- He continues to wear his oxygen at 3L at home during the day but says that he's not wearing it much at night because the tubing aggravates him. Instructed him to wear it at night as well. He also needs to commit to walking 10 minutes daily even if he has to break it up during the day. Has to reschedule his titration part of the sleep study because he no longer has a job and, therefore, no insurance. He has applied for disability and hopefully this will get approved quickly as he most likely needs bariatric  surgery.   Spent 10 minutes discussing how serious his health is and that if he doesn't start making some lifestyle changes, that he will most likely not be alive 5 years from now. Reinforced the importance of limiting his salt, decreasing fluid consumption and most importantly to get moving. Otherwise I'm concerned that his weight will continue to increase and then his mobility will decrease.   Return in 1 week for recheck and will get lab work that day as well.

## 2015-10-25 ENCOUNTER — Inpatient Hospital Stay: Payer: Medicaid Other

## 2015-10-25 ENCOUNTER — Encounter: Payer: Self-pay | Admitting: Family

## 2015-10-25 ENCOUNTER — Emergency Department: Payer: Medicaid Other

## 2015-10-25 ENCOUNTER — Inpatient Hospital Stay: Payer: Medicaid Other | Admitting: Anesthesiology

## 2015-10-25 ENCOUNTER — Ambulatory Visit: Payer: Medicaid Other | Admitting: Family

## 2015-10-25 ENCOUNTER — Inpatient Hospital Stay
Admission: EM | Admit: 2015-10-25 | Discharge: 2015-10-30 | DRG: 207 | Payer: Medicaid Other | Attending: Internal Medicine | Admitting: Internal Medicine

## 2015-10-25 ENCOUNTER — Encounter: Payer: Self-pay | Admitting: Emergency Medicine

## 2015-10-25 VITALS — BP 146/96 | HR 115 | Resp 25 | Ht 71.0 in | Wt >= 6400 oz

## 2015-10-25 DIAGNOSIS — R0902 Hypoxemia: Secondary | ICD-10-CM

## 2015-10-25 DIAGNOSIS — G9340 Encephalopathy, unspecified: Secondary | ICD-10-CM | POA: Diagnosis present

## 2015-10-25 DIAGNOSIS — I5033 Acute on chronic diastolic (congestive) heart failure: Secondary | ICD-10-CM | POA: Insufficient documentation

## 2015-10-25 DIAGNOSIS — Z87891 Personal history of nicotine dependence: Secondary | ICD-10-CM

## 2015-10-25 DIAGNOSIS — Z833 Family history of diabetes mellitus: Secondary | ICD-10-CM

## 2015-10-25 DIAGNOSIS — R0600 Dyspnea, unspecified: Secondary | ICD-10-CM | POA: Insufficient documentation

## 2015-10-25 DIAGNOSIS — Z8249 Family history of ischemic heart disease and other diseases of the circulatory system: Secondary | ICD-10-CM | POA: Diagnosis not present

## 2015-10-25 DIAGNOSIS — E662 Morbid (severe) obesity with alveolar hypoventilation: Secondary | ICD-10-CM | POA: Diagnosis present

## 2015-10-25 DIAGNOSIS — Z7982 Long term (current) use of aspirin: Secondary | ICD-10-CM | POA: Insufficient documentation

## 2015-10-25 DIAGNOSIS — R739 Hyperglycemia, unspecified: Secondary | ICD-10-CM | POA: Diagnosis present

## 2015-10-25 DIAGNOSIS — R000 Tachycardia, unspecified: Secondary | ICD-10-CM

## 2015-10-25 DIAGNOSIS — I509 Heart failure, unspecified: Secondary | ICD-10-CM

## 2015-10-25 DIAGNOSIS — R918 Other nonspecific abnormal finding of lung field: Secondary | ICD-10-CM

## 2015-10-25 DIAGNOSIS — J9801 Acute bronchospasm: Secondary | ICD-10-CM | POA: Diagnosis not present

## 2015-10-25 DIAGNOSIS — J80 Acute respiratory distress syndrome: Secondary | ICD-10-CM | POA: Insufficient documentation

## 2015-10-25 DIAGNOSIS — M109 Gout, unspecified: Secondary | ICD-10-CM | POA: Insufficient documentation

## 2015-10-25 DIAGNOSIS — I2699 Other pulmonary embolism without acute cor pulmonale: Secondary | ICD-10-CM | POA: Diagnosis present

## 2015-10-25 DIAGNOSIS — T502X5A Adverse effect of carbonic-anhydrase inhibitors, benzothiadiazides and other diuretics, initial encounter: Secondary | ICD-10-CM | POA: Diagnosis not present

## 2015-10-25 DIAGNOSIS — E876 Hypokalemia: Secondary | ICD-10-CM | POA: Diagnosis present

## 2015-10-25 DIAGNOSIS — I110 Hypertensive heart disease with heart failure: Secondary | ICD-10-CM | POA: Diagnosis present

## 2015-10-25 DIAGNOSIS — Z79899 Other long term (current) drug therapy: Secondary | ICD-10-CM | POA: Insufficient documentation

## 2015-10-25 DIAGNOSIS — Z800 Family history of malignant neoplasm of digestive organs: Secondary | ICD-10-CM | POA: Diagnosis not present

## 2015-10-25 DIAGNOSIS — J9622 Acute and chronic respiratory failure with hypercapnia: Secondary | ICD-10-CM | POA: Diagnosis present

## 2015-10-25 DIAGNOSIS — J189 Pneumonia, unspecified organism: Secondary | ICD-10-CM | POA: Diagnosis present

## 2015-10-25 DIAGNOSIS — J9690 Respiratory failure, unspecified, unspecified whether with hypoxia or hypercapnia: Secondary | ICD-10-CM | POA: Insufficient documentation

## 2015-10-25 DIAGNOSIS — Z6841 Body Mass Index (BMI) 40.0 and over, adult: Secondary | ICD-10-CM

## 2015-10-25 DIAGNOSIS — R600 Localized edema: Secondary | ICD-10-CM | POA: Insufficient documentation

## 2015-10-25 DIAGNOSIS — J9621 Acute and chronic respiratory failure with hypoxia: Secondary | ICD-10-CM | POA: Diagnosis present

## 2015-10-25 DIAGNOSIS — N179 Acute kidney failure, unspecified: Secondary | ICD-10-CM | POA: Diagnosis present

## 2015-10-25 DIAGNOSIS — Z4659 Encounter for fitting and adjustment of other gastrointestinal appliance and device: Secondary | ICD-10-CM

## 2015-10-25 DIAGNOSIS — F17200 Nicotine dependence, unspecified, uncomplicated: Secondary | ICD-10-CM | POA: Diagnosis present

## 2015-10-25 DIAGNOSIS — Z9911 Dependence on respirator [ventilator] status: Secondary | ICD-10-CM | POA: Insufficient documentation

## 2015-10-25 LAB — BLOOD GAS, ARTERIAL
ACID-BASE EXCESS: 8.3 mmol/L — AB (ref 0.0–3.0)
ACID-BASE EXCESS: 7.4 mmol/L — AB (ref 0.0–3.0)
ALLENS TEST (PASS/FAIL): POSITIVE — AB
Acid-Base Excess: 7.9 mmol/L — ABNORMAL HIGH (ref 0.0–3.0)
Allens test (pass/fail): POSITIVE — AB
Allens test (pass/fail): POSITIVE — AB
BICARBONATE: 36.6 meq/L — AB (ref 21.0–28.0)
Bicarbonate: 36.7 mEq/L — ABNORMAL HIGH (ref 21.0–28.0)
Bicarbonate: 38 mEq/L — ABNORMAL HIGH (ref 21.0–28.0)
DELIVERY SYSTEMS: POSITIVE
Delivery systems: POSITIVE
Expiratory PAP: 6
Expiratory PAP: 8
FIO2: 0.4
FIO2: 1
FIO2: 1
Inspiratory PAP: 12
Inspiratory PAP: 15
MECHANICAL RATE: 10
MECHVT: 600 mL
Mechanical Rate: 18
O2 Saturation: 90.7 %
O2 Saturation: 88.5 %
O2 Saturation: 86.1 %
PATIENT TEMPERATURE: 37
PCO2 ART: 71 mmHg — AB (ref 32.0–48.0)
PEEP/CPAP: 10 cmH2O
PH ART: 7.29 — AB (ref 7.350–7.450)
PH ART: 7.32 — AB (ref 7.350–7.450)
PO2 ART: 56 mmHg — AB (ref 83.0–108.0)
Patient temperature: 37
Patient temperature: 37
pCO2 arterial: 68 mmHg — ABNORMAL HIGH (ref 32.0–48.0)
pCO2 arterial: 79 mmHg (ref 32.0–48.0)
pH, Arterial: 7.34 — ABNORMAL LOW (ref 7.350–7.450)
pO2, Arterial: 64 mmHg — ABNORMAL LOW (ref 83.0–108.0)
pO2, Arterial: 62 mmHg — ABNORMAL LOW (ref 83.0–108.0)

## 2015-10-25 LAB — CBC WITH DIFFERENTIAL/PLATELET
BASOS ABS: 0 10*3/uL (ref 0–0.1)
Basophils Relative: 0 %
Eosinophils Absolute: 0 10*3/uL (ref 0–0.7)
HCT: 49.5 % (ref 40.0–52.0)
Hemoglobin: 14.7 g/dL (ref 13.0–18.0)
LYMPHS ABS: 3 10*3/uL (ref 1.0–3.6)
MCH: 20.9 pg — AB (ref 26.0–34.0)
MCHC: 29.7 g/dL — ABNORMAL LOW (ref 32.0–36.0)
MCV: 70.5 fL — AB (ref 80.0–100.0)
MONO ABS: 1.4 10*3/uL — AB (ref 0.2–1.0)
Monocytes Relative: 12 %
Neutro Abs: 7.2 10*3/uL — ABNORMAL HIGH (ref 1.4–6.5)
Neutrophils Relative %: 62 %
PLATELETS: 187 10*3/uL (ref 150–440)
RBC: 7.01 MIL/uL — ABNORMAL HIGH (ref 4.40–5.90)
RDW: 20.3 % — AB (ref 11.5–14.5)
WBC: 11.7 10*3/uL — ABNORMAL HIGH (ref 3.8–10.6)

## 2015-10-25 LAB — BASIC METABOLIC PANEL
Anion gap: 6 (ref 5–15)
BUN: 49 mg/dL — ABNORMAL HIGH (ref 6–20)
CO2: 34 mmol/L — AB (ref 22–32)
CREATININE: 1.59 mg/dL — AB (ref 0.61–1.24)
Calcium: 8.1 mg/dL — ABNORMAL LOW (ref 8.9–10.3)
Chloride: 97 mmol/L — ABNORMAL LOW (ref 101–111)
GFR calc Af Amer: >60 mL/min (ref >60)
GFR calc non Af Amer: 57 mL/min — ABNORMAL LOW (ref >60)
GLUCOSE: 102 mg/dL — AB (ref 65–99)
Potassium: 3.7 mmol/L (ref 3.5–5.1)
Sodium: 137 mmol/L (ref 135–145)

## 2015-10-25 LAB — TROPONIN I: Troponin I: <0.03 ng/mL (ref <0.031)

## 2015-10-25 LAB — GLUCOSE, CAPILLARY: GLUCOSE-CAPILLARY: 87 mg/dL (ref 65–99)

## 2015-10-25 LAB — TRIGLYCERIDES: Triglycerides: 257 mg/dL — ABNORMAL HIGH (ref <150)

## 2015-10-25 LAB — BRAIN NATRIURETIC PEPTIDE: B Natriuretic Peptide: 934 pg/mL — ABNORMAL HIGH (ref 0.0–100.0)

## 2015-10-25 MED ORDER — SENNOSIDES-DOCUSATE SODIUM 8.6-50 MG PO TABS: 1.0000 | ORAL_TABLET | Freq: Every evening | ORAL | Status: DC | PRN

## 2015-10-25 MED ORDER — ACETAMINOPHEN 325 MG PO TABS
650.0000 mg | ORAL_TABLET | Freq: Four times a day (QID) | ORAL | Status: DC | PRN
  Administered 2015-10-28 – 2015-10-30 (×4): 650 mg via ORAL
  Filled 2015-10-25 (×4): qty 2

## 2015-10-25 MED ORDER — IPRATROPIUM-ALBUTEROL 0.5-2.5 (3) MG/3ML IN SOLN
RESPIRATORY_TRACT | Status: AC
  Administered 2015-10-25: 21:00:00
  Filled 2015-10-25: qty 3

## 2015-10-25 MED ORDER — CHLORHEXIDINE GLUCONATE 0.12 % MT SOLN
15.0000 mL | Freq: Two times a day (BID) | OROMUCOSAL | Status: DC
  Administered 2015-10-25 – 2015-10-27 (×4): 15 mL via OROMUCOSAL
  Filled 2015-10-25: qty 15

## 2015-10-25 MED ORDER — ONDANSETRON HCL 4 MG PO TABS: 4.0000 mg | ORAL_TABLET | Freq: Four times a day (QID) | ORAL | Status: DC | PRN

## 2015-10-25 MED ORDER — ASPIRIN 325 MG PO TABS
325.0000 mg | ORAL_TABLET | Freq: Every day | ORAL | Status: DC
  Administered 2015-10-27 – 2015-10-30 (×4): 325 mg
  Filled 2015-10-25 (×5): qty 1

## 2015-10-25 MED ORDER — VECURONIUM BROMIDE 10 MG IV SOLR
10.0000 mg | INTRAVENOUS | Status: DC | PRN
  Administered 2015-10-25: 10 mg via INTRAVENOUS
  Filled 2015-10-25: qty 10

## 2015-10-25 MED ORDER — FENTANYL CITRATE (PF) 100 MCG/2ML IJ SOLN
50.0000 ug | INTRAMUSCULAR | Status: DC | PRN
  Administered 2015-10-27 – 2015-10-28 (×10): 100 ug via INTRAVENOUS
  Filled 2015-10-25 (×10): qty 2

## 2015-10-25 MED ORDER — ALUM & MAG HYDROXIDE-SIMETH 200-200-20 MG/5ML PO SUSP: 30.0000 mL | Freq: Four times a day (QID) | ORAL | Status: DC | PRN

## 2015-10-25 MED ORDER — ALBUTEROL SULFATE HFA 108 (90 BASE) MCG/ACT IN AERS: 2.0000 | INHALATION_SPRAY | RESPIRATORY_TRACT | Status: DC | PRN

## 2015-10-25 MED ORDER — ALLOPURINOL 100 MG PO TABS
100.0000 mg | ORAL_TABLET | Freq: Every day | ORAL | Status: DC
  Administered 2015-10-27 – 2015-10-30 (×4): 100 mg
  Filled 2015-10-25 (×4): qty 1

## 2015-10-25 MED ORDER — ONDANSETRON HCL 4 MG/2ML IJ SOLN: 4.0000 mg | Freq: Four times a day (QID) | INTRAMUSCULAR | Status: DC | PRN

## 2015-10-25 MED ORDER — SODIUM CHLORIDE 0.9 % IJ SOLN
3.0000 mL | Freq: Two times a day (BID) | INTRAMUSCULAR | Status: DC
  Administered 2015-10-25 – 2015-10-30 (×10): 3 mL via INTRAVENOUS

## 2015-10-25 MED ORDER — ALBUTEROL SULFATE (2.5 MG/3ML) 0.083% IN NEBU: 2.5000 mg | INHALATION_SOLUTION | RESPIRATORY_TRACT | Status: DC | PRN

## 2015-10-25 MED ORDER — IPRATROPIUM-ALBUTEROL 0.5-2.5 (3) MG/3ML IN SOLN
3.0000 mL | Freq: Four times a day (QID) | RESPIRATORY_TRACT | Status: DC
  Administered 2015-10-25 – 2015-10-30 (×21): 3 mL via RESPIRATORY_TRACT
  Filled 2015-10-25 (×20): qty 3

## 2015-10-25 MED ORDER — PROPOFOL 1000 MG/100ML IV EMUL
INTRAVENOUS | Status: AC
  Administered 2015-10-25: 40 ug/kg/min
  Filled 2015-10-25: qty 100

## 2015-10-25 MED ORDER — FENTANYL CITRATE (PF) 100 MCG/2ML IJ SOLN
INTRAMUSCULAR | Status: AC
  Administered 2015-10-25: 100 ug
  Filled 2015-10-25: qty 2

## 2015-10-25 MED ORDER — ASPIRIN EC 81 MG PO TBEC
81.0000 mg | DELAYED_RELEASE_TABLET | Freq: Every day | ORAL | Status: DC
  Administered 2015-10-25: 81 mg via ORAL
  Filled 2015-10-25: qty 1

## 2015-10-25 MED ORDER — BUDESONIDE 0.25 MG/2ML IN SUSP
0.2500 mg | Freq: Four times a day (QID) | RESPIRATORY_TRACT | Status: DC
  Administered 2015-10-26 – 2015-10-30 (×18): 0.25 mg via RESPIRATORY_TRACT
  Filled 2015-10-25 (×18): qty 2

## 2015-10-25 MED ORDER — MIDAZOLAM HCL 2 MG/2ML IJ SOLN
INTRAMUSCULAR | Status: AC
  Filled 2015-10-25: qty 2

## 2015-10-25 MED ORDER — FUROSEMIDE 10 MG/ML IJ SOLN
80.0000 mg | Freq: Four times a day (QID) | INTRAMUSCULAR | Status: DC
  Administered 2015-10-25 – 2015-10-26 (×3): 80 mg via INTRAVENOUS
  Filled 2015-10-25 (×3): qty 8

## 2015-10-25 MED ORDER — PROPOFOL 1000 MG/100ML IV EMUL
0.0000 ug/kg/min | INTRAVENOUS | Status: DC
  Administered 2015-10-25 (×3): 50 ug/kg/min via INTRAVENOUS
  Administered 2015-10-26: 40 ug/kg/min via INTRAVENOUS
  Administered 2015-10-26: 70 ug/kg/min via INTRAVENOUS
  Administered 2015-10-26 (×4): 40 ug/kg/min via INTRAVENOUS
  Administered 2015-10-26: 53.669 ug/kg/min via INTRAVENOUS
  Administered 2015-10-26 (×5): 40 ug/kg/min via INTRAVENOUS
  Administered 2015-10-26: 70 ug/kg/min via INTRAVENOUS
  Administered 2015-10-26: 50 ug/kg/min via INTRAVENOUS
  Administered 2015-10-26: 40 ug/kg/min via INTRAVENOUS
  Administered 2015-10-27: 44.969 ug/kg/min via INTRAVENOUS
  Administered 2015-10-27 (×3): 40 ug/kg/min via INTRAVENOUS
  Administered 2015-10-27 (×2): 0 ug/kg/min via INTRAVENOUS
  Administered 2015-10-27: 50 ug/kg/min via INTRAVENOUS
  Administered 2015-10-27: 40 ug/kg/min via INTRAVENOUS
  Administered 2015-10-27: 10 ug/kg/min via INTRAVENOUS
  Administered 2015-10-28 (×2): 50 ug/kg/min via INTRAVENOUS
  Administered 2015-10-28: 10 ug/kg/min via INTRAVENOUS
  Administered 2015-10-28 – 2015-10-29 (×6): 50 ug/kg/min via INTRAVENOUS
  Administered 2015-10-29 (×4): 60 ug/kg/min via INTRAVENOUS
  Administered 2015-10-29: 50 ug/kg/min via INTRAVENOUS
  Administered 2015-10-29: 60 ug/kg/min via INTRAVENOUS
  Administered 2015-10-30 (×7): 50 ug/kg/min via INTRAVENOUS
  Filled 2015-10-25 (×64): qty 100

## 2015-10-25 MED ORDER — ACETAMINOPHEN 650 MG RE SUPP: 650.0000 mg | Freq: Four times a day (QID) | RECTAL | Status: DC | PRN

## 2015-10-25 MED ORDER — HEPARIN SODIUM (PORCINE) 5000 UNIT/ML IJ SOLN: 5000.0000 [IU] | Freq: Three times a day (TID) | INTRAMUSCULAR | Status: DC

## 2015-10-25 MED ORDER — FAMOTIDINE IN NACL 20-0.9 MG/50ML-% IV SOLN
20.0000 mg | Freq: Two times a day (BID) | INTRAVENOUS | Status: DC
  Administered 2015-10-26 – 2015-10-30 (×9): 20 mg via INTRAVENOUS
  Filled 2015-10-25 (×12): qty 50

## 2015-10-25 MED ORDER — DOCUSATE SODIUM 50 MG/5ML PO LIQD: 100.0000 mg | Freq: Two times a day (BID) | ORAL | Status: DC

## 2015-10-25 MED ORDER — INFLUENZA VAC SPLIT QUAD 0.5 ML IM SUSY: 0.5000 mL | PREFILLED_SYRINGE | INTRAMUSCULAR | Status: DC

## 2015-10-25 MED ORDER — FUROSEMIDE 10 MG/ML IJ SOLN
INTRAMUSCULAR | Status: AC
  Administered 2015-10-25: 80 mg via INTRAVENOUS
  Filled 2015-10-25: qty 10

## 2015-10-25 MED ORDER — ALLOPURINOL 100 MG PO TABS
100.0000 mg | ORAL_TABLET | Freq: Every day | ORAL | Status: DC
  Administered 2015-10-25: 100 mg via ORAL
  Filled 2015-10-25: qty 1

## 2015-10-25 MED ORDER — CETYLPYRIDINIUM CHLORIDE 0.05 % MT LIQD
7.0000 mL | Freq: Two times a day (BID) | OROMUCOSAL | Status: DC
  Administered 2015-10-26 (×2): 7 mL via OROMUCOSAL

## 2015-10-25 MED ORDER — FUROSEMIDE 10 MG/ML IJ SOLN
80.0000 mg | Freq: Once | INTRAMUSCULAR | Status: AC
  Administered 2015-10-25: 80 mg via INTRAVENOUS

## 2015-10-25 MED ORDER — FUROSEMIDE 10 MG/ML IJ SOLN: 40.0000 mg | Freq: Once | INTRAMUSCULAR | Status: DC

## 2015-10-25 MED ORDER — BUDESONIDE 0.25 MG/2ML IN SUSP
0.2500 mg | Freq: Four times a day (QID) | RESPIRATORY_TRACT | Status: DC
  Administered 2015-10-25: 0.25 mg via RESPIRATORY_TRACT
  Filled 2015-10-25: qty 2

## 2015-10-25 MED ORDER — PROPOFOL 10 MG/ML IV BOLUS
INTRAVENOUS | Status: DC | PRN
  Administered 2015-10-25: 160 mg via INTRAVENOUS

## 2015-10-25 MED ORDER — HYDROCODONE-ACETAMINOPHEN 5-325 MG PO TABS: 1.0000 | ORAL_TABLET | ORAL | Status: DC | PRN

## 2015-10-25 MED ORDER — FUROSEMIDE 10 MG/ML IJ SOLN
80.0000 mg | Freq: Three times a day (TID) | INTRAMUSCULAR | Status: DC
  Administered 2015-10-25: 80 mg via INTRAVENOUS
  Filled 2015-10-25: qty 8

## 2015-10-25 MED ORDER — STERILE WATER FOR INJECTION IJ SOLN
INTRAMUSCULAR | Status: AC
  Administered 2015-10-25: 23:00:00
  Filled 2015-10-25: qty 10

## 2015-10-25 MED ORDER — PNEUMOCOCCAL VAC POLYVALENT 25 MCG/0.5ML IJ INJ: 0.5000 mL | INJECTION | INTRAMUSCULAR | Status: DC

## 2015-10-25 MED ORDER — SUCCINYLCHOLINE CHLORIDE 20 MG/ML IJ SOLN
INTRAMUSCULAR | Status: DC | PRN
  Administered 2015-10-25: 120 mg via INTRAVENOUS

## 2015-10-25 NOTE — Anesthesia Preprocedure Evaluation (Signed)
Anesthesia Evaluation  Patient identified by MRN, date of birth, ID band Patient confused    Reviewed: Allergy & Precautions, NPO status , Patient's Chart, lab work & pertinent test results  Airway Mallampati: III  TM Distance: >3 FB Neck ROM: Full    Dental  (+) Chipped   Pulmonary sleep apnea, Continuous Positive Airway Pressure Ventilation and Oxygen sleep apnea , COPD,  COPD inhaler and oxygen dependent, former smoker,    + rhonchi  + decreased breath sounds unstable     Cardiovascular hypertension, Pt. on medications +CHF  Normal cardiovascular exam     Neuro/Psych Hypoxia negative psych ROS   GI/Hepatic Neg liver ROS, GERD  ,  Endo/Other  negative endocrine ROS  Renal/GU negative Renal ROS  negative genitourinary   Musculoskeletal negative musculoskeletal ROS (+)   Abdominal (+) + obese,   Peds negative pediatric ROS (+)  Hematology   Anesthesia Other Findings Morbid obesity  Reproductive/Obstetrics                             Anesthesia Physical Anesthesia Plan  ASA: IV and emergent  Anesthesia Plan: General   Post-op Pain Management:    Induction: Intravenous, Rapid sequence and Cricoid pressure planned  Airway Management Planned: Oral ETT  Additional Equipment:   Intra-op Plan:   Post-operative Plan:   Informed Consent: I have reviewed the patients History and Physical, chart, labs and discussed the procedure including the risks, benefits and alternatives for the proposed anesthesia with the patient or authorized representative who has indicated his/her understanding and acceptance.   Dental advisory given  Plan Discussed with: Surgeon  Anesthesia Plan Comments:         Anesthesia Quick Evaluation

## 2015-10-25 NOTE — ED Notes (Signed)
Report Clovis PuBrittany T, Charity fundraiserN.

## 2015-10-25 NOTE — Progress Notes (Signed)
[facility] that patient has become less responsive and oxygen saturation is between 78-89% on 100% BIPAP. Respiratory notified several times and blood gas also ordered. Notified Dr. Elpidio AnisSudini and informed him of patient status change and new order received to check ABG and consult for pulmonary as well. Patients mother at bedside. Report given to night nurse and physicians have been notified.

## 2015-10-25 NOTE — Anesthesia Procedure Notes (Signed)
Procedure Name: Intubation Performed by: Yves DillARROLL, Winston Misner Pre-anesthesia Checklist: Patient identified, Emergency Drugs available, Suction available, Patient being monitored and Timeout performed Patient Re-evaluated:Patient Re-evaluated prior to inductionOxygen Delivery Method: Ambu bag and Non-rebreather mask Preoxygenation: Pre-oxygenation with 100% oxygen Intubation Type: IV induction, Rapid sequence and Cricoid Pressure applied Ventilation: Mask ventilation without difficulty Laryngoscope Size: 3 Tube type: Oral Tube size: 7.5 mm Number of attempts: 1 Airway Equipment and Method: Patient positioned with wedge pillow and Stylet Placement Confirmation: ETT inserted through vocal cords under direct vision,  positive ETCO2,  CO2 detector and breath sounds checked- equal and bilateral Secured at: 24 cm Dental Injury: Teeth and Oropharynx as per pre-operative assessment  Future Recommendations: Recommend- induction with short-acting agent, and alternative techniques readily available Comments: All precautions taken with glidescope , bougie and fiberoptic on standby

## 2015-10-25 NOTE — ED Notes (Signed)
Retaining fluid with increased shortness of breath, was seen at heart failure clinic with low sat 52%, with improvement to 72% on home o2 at 2 liters, pt visibly short of breat. sats 93% RA

## 2015-10-25 NOTE — Progress Notes (Signed)
eLink Physician-Brief Progress Note Patient Name: Loma NewtonMichael J Tirpak DOB: 1985-12-16 MRN: [id number]   Date of Service  10/25/2015  HPI/Events of Note  29 yo male with PMH of morbid obesity, OSA, diastolic heart failure and gout. Admitted to ICU with acute on chronic diastolic heart failure. CXR >> enlarged cor with pulmonary vascular congestion. Currently on BiPAP and Lasix. BP = 97/78 and HR = 92. Sat = 93% Management by the Hospitalist Group.   eICU Interventions  Continue current management.      Intervention Category Evaluation Type: New Patient Evaluation  Lenell AntuSommer,Yousuf Ager Eugene 10/25/2015, 5:55 PM

## 2015-10-25 NOTE — Consult Note (Signed)
PULMONARY / CRITICAL CARE MEDICINE   Name: Theodore Reid MRN: [id number] DOB: 12-Feb-1986    ADMISSION DATE:  10/25/2015 CONSULTATION DATE: 11/28  REFERRING MD : Deboraha Sprang  INITIAL PRESENTATION/PT PROFILE:  29 morbidly obese M admitted with progressive DOE, LE edema, wt gain. Failed BiPAP with worsening hypoxemia and hypercarbia. Intubated by anesthesia. Refractory hypoxemia after intubation. Concern for PE, R>L shunt. CXR difficult to interpret but seems to show pulm edema pattern with cardiomegaly  HISTORY OF PRESENT ILLNESS:   Level 5 caveat. Admission note reviewed in detail  PAST MEDICAL HISTORY :  He  has a past medical history of Morbid obesity (HCC); Gout; History of echocardiogram; and CHF (congestive heart failure) (HCC). OHS/OSA  PAST SURGICAL HISTORY: He  has past surgical history that includes Tonsillectomy.  No Known Allergies  No current facility-administered medications on file prior to encounter.   Current Outpatient Prescriptions on File Prior to Encounter  Medication Sig  . acetaminophen (TYLENOL) 325 MG tablet Take 650 mg by mouth every 6 (six) hours as needed for headache.  . albuterol (PROVENTIL HFA;VENTOLIN HFA) 108 (90 BASE) MCG/ACT inhaler Inhale 2 puffs into the lungs every 4 (four) hours as needed for wheezing or shortness of breath.  . allopurinol (ZYLOPRIM) 100 MG tablet Take 1 tablet (100 mg total) by mouth daily.  Marland Kitchen aspirin EC 81 MG tablet Take 1 tablet (81 mg total) by mouth daily.  . furosemide (LASIX) 20 MG tablet Take 1 tablet (20 mg total) by mouth 2 (two) times daily.    FAMILY HISTORY:  His indicated that his mother is alive. He indicated that his father is deceased. He indicated that his brother is alive.   SOCIAL HISTORY: He  reports that he quit smoking about 6 months ago. His smoking use included Cigarettes. He has a 11 pack-year smoking history. He has never used smokeless tobacco. He reports that he does not drink alcohol or use illicit  drugs.  REVIEW OF SYSTEMS:   Level 5 caveat  SUBJECTIVE:   VITAL SIGNS: BP 142/91 mmHg  Pulse 95  Temp(Src) 98.8 F (37.1 C) (Oral)  Resp 28  Ht  (1.803 m)  Wt 627 lb 3.3 oz (284.5 kg)  BMI 87.52 kg/m2  SpO2 88%  HEMODYNAMICS:    VENTILATOR SETTINGS: Vent Mode:  [-] PRVC FiO2 (%):  [90 %-100 %] 100 % Set Rate:  [18 bmp] 18 bmp Vt Set:  [600 mL] 600 mL PEEP:  [10 cmH20] 10 cmH20  INTAKE / OUTPUT: I/O last 3 completed shifts: In: -  Out: 1950 [Urine:1950]  PHYSICAL EXAMINATION: General:  Morbidly obese, intubated, RASS -2, not F/C Neuro: CNs intact, MAEs HEENT:  [place]/AT, very short thick neck Cardiovascular: distant HS, no M noted Lungs: diffuse scattered wheezes anteriorly Abdomen: markedly obese, soft, NT, diminished BS Ext: chronic stasis changes, 2-3+ symmetric pitting and brawny LE edema  LABS:  CBC  Recent Labs Lab 10/25/15 1138  WBC 11.7*  HGB 14.7  HCT 49.5  PLT 187   Coag's No results for input(s): APTT, INR in the last 168 hours. BMET  Recent Labs Lab 10/25/15 1138  NA 137  K 3.7  CL 97*  CO2 34*  BUN 49*  CREATININE 1.59*  GLUCOSE 102*   Electrolytes  Recent Labs Lab 10/25/15 1138  CALCIUM 8.1*   Sepsis Markers No results for input(s): LATICACIDVEN, PROCALCITON, O2SATVEN in the last 168 hours. ABG  Recent Labs Lab 10/25/15 1155 10/25/15 1859  PHART 7.34* 7.29*  PCO2ART 68* 79*  PO2ART 64* 62*   Liver Enzymes No results for input(s): AST, ALT, ALKPHOS, BILITOT, ALBUMIN in the last 168 hours. Cardiac Enzymes  Recent Labs Lab 10/25/15 1138  TROPONINI <0.03   Glucose  Recent Labs Lab 10/25/15 1721  GLUCAP 87    Imaging Dg Chest 1 View  10/25/2015  CLINICAL DATA:  Pulmonary edema. EXAM: CHEST 1 VIEW COMPARISON:  04/16/2015 FINDINGS: Technically limited study due to morbid obesity and portable technique. Cardiac enlargement with vascular congestion. No definite edema or effusion. Bibasilar atelectasis.  IMPRESSION: Limited study. Probable mild fluid overload. Two-view chest x-ray suggested for better quality images. Electronically Signed   By: Marlan Palauharles  Clark M.D.   On: 10/25/2015 11:45     STUDIES:  11/29 LE venos Doppler US:  11/29 TTE with bubble study:   CULTURES: Resp 11/28 >>   ANTIBIOTICS:   SIGNIFICANT EVENTS:   LINES/TUBES: L IJ CVL 11/28 >>   ASSESSMENT / PLAN:  PULMONARY A: Acute on chronic hypoxic and hypercarbic respiratory failure OHS/OSA Smoker Bronchospasm Refractory hypoxemia - concern for R>L shunt, R/O PE Pulm edema pattern vs ALI/ARDS on CXR P:   Vent settings established Vent bundle implemented Daily SBT as indicated Scheduled nebulized steroids Scheduled and PRN nebulized bronchodilators Empiric full dose heparin until VTE confidently ruled out LE venous US studies ordered  CARDIOVASCULAR A:  Diastolic HF  P:  CVP not likely to be helpful MAP goal > 65 mmHg TTE with bubble contrast ordered 11/28  RENAL A:   Hypervolemia, likely chronic AKI (baseline Cr 0.84) P:   Monitor BMET intermittently Monitor I/Os Correct electrolytes as indicated  GASTROINTESTINAL A:   Morbid obesity P:   SUP: IV famotidine Consider nutrition 11/29 (would favor intentional underfeeding @ 80% calculated caloric need)  HEMATOLOGIC A:   No issues P:  DVT px: full dose heparin until VTE R/O'd Monitor CBC intermittently Transfuse per usual guidelines  INFECTIOUS A:   No identified infections P:   Monitor temp, WBC count Micro and abx as above PCT protocol ordered 11/28  ENDOCRINE A:   Mild hyperglycemia without documented history of DM P:   Monitor glu on chem panels Consider SSI for glu > 180  NEUROLOGIC A:   Acute encephalopathy of critical illness Intermittent agitation with ventilator dyssynchrony P:   RASS goal: -2, -3 Propofol gtt PRN fentanyl PRN vecuronium for vent dyssynch   FAMILY  - Updates: Will update family  11/28  CCM time: 45 mins The above time includes time spent in consultation with patient and/or family members and reviewing care plan on multidisciplinary rounds  Billy Fischeravid Simonds, MD PCCM service Mobile [phone] Pager [phone]     10/25/2015, 9:23 PM

## 2015-10-25 NOTE — Anesthesia Postprocedure Evaluation (Addendum)
Anesthesia Post Note  Patient: Loma NewtonMichael J Moyd  Procedure(s) Performed: * Emergent intubation per request of ICU MD  Patient location during evaluation: ICU Anesthesia Type: General Level of consciousness: lethargic, confused and obtunded/minimal responses Pain management: pain level controlled Vital Signs Assessment: vitals unstable Respiratory status: patient remains intubated per anesthesia plan Cardiovascular status: blood pressure returned to baseline Anesthetic complications: no    Last Vitals:  Filed Vitals:   10/25/15 1840 10/25/15 1900  BP:  142/91  Pulse: 97 95  Temp:    Resp: 0 28    Last Pain:  Filed Vitals:   10/25/15 1939  PainSc: 0-No pain                 Jailey Booton

## 2015-10-25 NOTE — Progress Notes (Signed)
Paged by Nurse regarding worsening resp status. Patient drowzy on Bipap. Opens eyes.  HR 95 BP 142/91. Sats 89% on 100% BIpap  S1, S2 Lungs poor airentry b/l Edema  *Acute on chronic diastolic CHF Worsening resp status. Check ABG. STAT 80mg  IV lasix Discussing with family at bedside regarding vent and are in agreement if needed.  Reviewed ABG. Worsening Ph and PCo2. WIll need intubation. [facility]  Can be a difficult intubation and i have discussed with anesthesia for help with intubation.  CRITICALLY ILL.  TIME SPENT 25 minutes

## 2015-10-25 NOTE — Progress Notes (Addendum)
Subjective:    Patient ID: Theodore Reid, male    DOB: [birth year], 29 y.o.   MRN: [id number]  Congestive Heart Failure Presents for follow-up visit. The disease course has been worsening. Associated symptoms include edema, fatigue, muscle weakness, orthopnea, palpitations and shortness of breath. Pertinent negatives include no abdominal pain, chest pain or chest pressure. The symptoms have been worsening. Past treatments include salt and fluid restriction and oxygen. The treatment provided no relief. Compliance with prior treatments has been variable. His past medical history is significant for chronic lung disease. There is no history of anemia, CVA, DM or DVT. He has one 1st degree relative with heart disease. Compliance with total regimen is 76-100%. Compliance problems include adherence to exercise.   Shortness of Breath This is a chronic problem. The current episode started more than 1 year ago. The problem occurs constantly. The problem has been gradually worsening. Associated symptoms include leg swelling, a rash, sputum production, vomiting (this morning) and wheezing. Pertinent negatives include no abdominal pain, chest pain, ear pain, headaches or sore throat. The symptoms are aggravated by any activity. His past medical history is significant for chronic lung disease and a heart failure. There is no history of asthma or DVT.    Past Medical History  Diagnosis Date  . Morbid obesity (HCC)   . Gout   . History of echocardiogram     a. a. echo 04/15/2015: EF 60-65%, no WMA, LA nl in size, RV poorly visualized, PASP nl, grossly nl study, challening study 2/2 body habitus   . CHF (congestive heart failure) [facility](HCC)     Past Surgical History  Procedure Laterality Date  . Tonsillectomy      Family History  Problem Relation Age of Onset  . Diabetes Mother   . CAD Mother   . Hypertension Mother   . Diabetes Father   . Cancer Father     rectal  . Diabetes Brother     Social  History  Substance Use Topics  . Smoking status: Former Smoker -- 1.00 packs/day for 11 years    Types: Cigarettes    Quit date: 04/14/2015  . Smokeless tobacco: Never Used  . Alcohol Use: No    No Known Allergies  Prior to Admission medications   Medication Sig Start Date End Date Taking? Authorizing Provider  acetaminophen (TYLENOL) 325 MG tablet Take 650 mg by mouth every 6 (six) hours as needed for headache.   Yes Historical Provider, MD  albuterol (PROVENTIL HFA;VENTOLIN HFA) 108 (90 BASE) MCG/ACT inhaler Inhale 2 puffs into the lungs every 4 (four) hours as needed for wheezing or shortness of breath. 04/17/15  Yes Ramonita LabAruna Gouru, MD  allopurinol (ZYLOPRIM) 100 MG tablet Take 1 tablet (100 mg total) by mouth daily. 06/18/15  Yes Margaretann LovelessJennifer M Burnette, PA-C  aspirin EC 81 MG tablet Take 1 tablet (81 mg total) by mouth daily. 04/17/15  Yes Ramonita LabAruna Gouru, MD  furosemide (LASIX) 20 MG tablet Take 1 tablet (20 mg total) by mouth 2 (two) times daily. 05/14/15  Yes Delma Freezeina A Tomio Kirk, FNP     Review of Systems  Constitutional: Positive for appetite change (decreased) and fatigue.  HENT: Negative for congestion, ear pain, postnasal drip and sore throat.   Respiratory: Positive for cough (white sputum), sputum production, shortness of breath and wheezing. Negative for chest tightness.   Cardiovascular: Positive for palpitations and leg swelling. Negative for chest pain.  Gastrointestinal: Positive for nausea, vomiting (this morning) and abdominal distention. Negative  for abdominal pain.  Endocrine: Negative.   Genitourinary: Negative.   Musculoskeletal: Positive for muscle weakness. Negative for back pain.  Skin: Positive for rash.  Allergic/Immunologic: Negative.   Neurological: Positive for dizziness, weakness and light-headedness. Negative for headaches.  Hematological: Negative for adenopathy. Does not bruise/bleed easily.  Psychiatric/Behavioral: Positive for sleep disturbance (sleeping on 2  pillows on his side; tossing and turning) and dysphoric mood. Negative for suicidal ideas. The patient is not nervous/anxious.        Objective:   Physical Exam  Constitutional: He is oriented to person, place, and time. He appears well-developed and well-nourished.  HENT:  Head: Normocephalic and atraumatic.  Eyes: Conjunctivae are normal. Pupils are equal, round, and reactive to light.  Neck: Normal range of motion. Neck supple.  Cardiovascular: Regular rhythm.  Tachycardia present.   Pulmonary/Chest: Tachypnea noted. He has no wheezes. He has rales.  Abdominal: He exhibits distension. There is no tenderness.  Musculoskeletal: He exhibits edema. He exhibits no tenderness.  Neurological: He is alert and oriented to person, place, and time.  Skin: Skin is warm and dry.  Psychiatric: He has a normal mood and affect. His behavior is normal. Thought content normal.  Nursing note and vitals reviewed.   BP 146/96 mmHg  Pulse 115  Resp 25  Ht  (1.803 m)  Wt 630 lb (285.766 kg)  BMI 87.91 kg/m2  SpO2 69%         Assessment & Plan:  1: Acute on chronic heart failure with preserved ejection fraction- Patient presents with worsening shortness of breath, abdominal tightness, worsening fatigue and another couple pound weight gain from last week. Patient took zaroxolyn for 4 days but without relief. Today, his oxygen level is low, heart rate and respirations are elevated and abdomen/legs remain tight. Symptoms began 2 weeks ago but he didn't come into the office until last week. Based on worsening symptoms and worsening vitals along with continued weight gain, discussed going to the ER for IV diuresis. Patient and mom are both agreeable to that and would prefer to go by private vehicle due to having difficulty getting on/off a stretcher. Patient escorted to his vehicle via wheelchair by CMA and mom verbalizes that she will take him to the ER. Called over to the ER to give a brief report.  Will schedule a followup appointment pending his ER visit. 2: Tachycardia- Heart rate elevated today. Going to ER. 3: Hypoxia- Oxygen level upon walking into the office was 69% on 3L and improved to 79% on 3L after sitting for a few minutes. Sending to the ER per above.

## 2015-10-25 NOTE — ED Provider Notes (Addendum)
[facility] Emergency Department Provider Note  ____________________________________________  Time seen: Approximately 11 AM  I have reviewed the triage vital signs and the nursing notes.   HISTORY  Chief Complaint Respiratory Distress    HPI Theodore Reid is a 29 y.o. male history of morbid obesity and CHF who is presenting today with pulmonary edema. He was sent over for further evaluation by Dr. Mariah MillingGollan.  The patient says that he has been having increasing shortness of breath and increased his home oxygen from 2 L to 3 L. He denies any chest pain. He says that he has short of breath even while at rest.He currently takes Lasix 20 mg twice a day.   Past Medical History  Diagnosis Date  . Morbid obesity (HCC)   . Gout   . History of echocardiogram     a. a. echo 04/15/2015: EF 60-65%, no WMA, LA nl in size, RV poorly visualized, PASP nl, grossly nl study, challening study 2/2 body habitus   . CHF (congestive heart failure) [facility](HCC)     Patient Active Problem List   Diagnosis Date Noted  . Acute on chronic diastolic heart failure (HCC) 10/19/2015  . Essential hypertension 10/19/2015  . Tachycardia 10/19/2015  . Chronic diastolic CHF (congestive heart failure) (HCC) 04/22/2015  . Obesity hypoventilation syndrome (HCC) 04/22/2015  . Sleep apnea 04/22/2015  . Hypoxia 04/14/2015    Past Surgical History  Procedure Laterality Date  . Tonsillectomy      Current Outpatient Rx  Name  Route  Sig  Dispense  Refill  . acetaminophen (TYLENOL) 325 MG tablet   Oral   Take 650 mg by mouth every 6 (six) hours as needed for headache.         . albuterol (PROVENTIL HFA;VENTOLIN HFA) 108 (90 BASE) MCG/ACT inhaler   Inhalation   Inhale 2 puffs into the lungs every 4 (four) hours as needed for wheezing or shortness of breath.   1 Inhaler   2   . allopurinol (ZYLOPRIM) 100 MG tablet   Oral   Take 1 tablet (100 mg total) by mouth daily.   30 tablet   6   .  aspirin EC 81 MG tablet   Oral   Take 1 tablet (81 mg total) by mouth daily.   30 tablet   0   . furosemide (LASIX) 20 MG tablet   Oral   Take 1 tablet (20 mg total) by mouth 2 (two) times daily.   60 tablet   5     Allergies Review of patient's allergies indicates no known allergies.  Family History  Problem Relation Age of Onset  . Diabetes Mother   . CAD Mother   . Hypertension Mother   . Diabetes Father   . Cancer Father     rectal  . Diabetes Brother     Social History Social History  Substance Use Topics  . Smoking status: Former Smoker -- 1.00 packs/day for 11 years    Types: Cigarettes    Quit date: 04/14/2015  . Smokeless tobacco: Never Used  . Alcohol Use: No    Review of Systems Constitutional: No fever/chills Eyes: No visual changes. ENT: No sore throat. Cardiovascular: Denies chest pain. Respiratory: As above  Gastrointestinal: No abdominal pain.  No nausea, no vomiting.  No diarrhea.  No constipation. Genitourinary: Negative for dysuria. Musculoskeletal: Negative for back pain. Skin: Negative for rash. Neurological: Negative for headaches, focal weakness or numbness.  10-point ROS otherwise negative.  ____________________________________________   PHYSICAL EXAM:  VITAL SIGNS: ED Triage Vitals  Enc Vitals Group     BP 10/25/15 1042 123/71 mmHg     Pulse Rate 10/25/15 1042 108     Resp 10/25/15 1042 28     Temp 10/25/15 1042 98.7 F (37.1 C)     Temp Source 10/25/15 1042 Oral     SpO2 10/25/15 1042 93 %     Weight 10/25/15 1042 630 lb (285.766 kg)     Height 10/25/15 1042  (1.803 m)     Head Cir --      Peak Flow --      Pain Score 10/25/15 1043 0     Pain Loc --      Pain Edu? --      Excl. in GC? --     Constitutional: Alert and oriented. Tachypneic but speaking in full sentences.  Eyes: Conjunctivae are normal. PERRL. EOMI. Head: Atraumatic. Nose: No congestion/rhinnorhea. Mouth/Throat: Mucous membranes are moist.   Oropharynx non-erythematous. Neck: No stridor.   Cardiovascular: Normal rate, regular rhythm. Grossly normal heart sounds.  Good peripheral circulation. Respiratory: Increased respiratory effort with rales bilaterally to the mid fields. He is wearing his oxygen at 3 L in the room but D satting to the high 70s.  Gastrointestinal: Soft and nontender. No distention. No abdominal bruits. No CVA tenderness. Musculoskeletal: Moderate to severe bilateral lower extremity edema.  Neurologic:  Normal speech and language. No gross focal neurologic deficits are appreciated. No gait instability. Skin:  Skin is warm, dry and intact. No rash noted. Psychiatric: Mood and affect are normal. Speech and behavior are normal.  ____________________________________________   LABS (all labs ordered are listed, but only abnormal results are displayed)  Labs Reviewed  CBC WITH DIFFERENTIAL/PLATELET  BRAIN NATRIURETIC PEPTIDE  BASIC METABOLIC PANEL  TROPONIN I  BLOOD GAS, ARTERIAL   ____________________________________________  EKG  ED ECG REPORT I, Unnamed Zeien,  Teena Irani, the attending physician, personally viewed and interpreted this ECG.   Date: 10/25/2015  EKG Time: 1054  Rate: 109  Rhythm: sinus tachycardia  Axis: Normal axis  Intervals:none  ST&T Change: No ST segment elevation or depression. No abnormal T-wave inversions.  ____________________________________________  RADIOLOGY  Pending chest x-ray results. ____________________________________________   PROCEDURES  CRITICAL CARE Performed by: Arelia Longest   Total critical care time: 35 minutes  Critical care time was exclusive of separately billable procedures and treating other patients.  Critical care was necessary to treat or prevent imminent or life-threatening deterioration.  Critical care was time spent personally by me on the following activities: development of treatment plan with patient and/or surrogate as well as  nursing, discussions with consultants, evaluation of patient's response to treatment, examination of patient, obtaining history from patient or surrogate, ordering and performing treatments and interventions, ordering and review of laboratory studies, ordering and review of radiographic studies, pulse oximetry and re-evaluation of patient's condition.   ____________________________________________   INITIAL IMPRESSION / ASSESSMENT AND PLAN / ED COURSE  Pertinent labs & imaging results that were available during my care of the patient were reviewed by me and considered in my medical decision making (see chart for details). ----------------------------------------- 11:25 AM on 10/25/2015 -----------------------------------------   Patient with tachypnea, desaturation on 6 L of nasal cannula oxygen. However, does have improvement of saturations to the low 90s on 6 L. We'll start on BiPAP for assistance with the shortness of breath as well as to help with any fluid on  the lungs. Discussed with patient as well as his mother the need for admission to the hospital. Signed out to Dr. Tildon Husky who will be admitting the patient. ____________________________________________   FINAL CLINICAL IMPRESSION(S) / ED DIAGNOSES  Acute on chronic CHF.    Myrna Blazer, MD 10/25/15 1126  Patient is tolerating the BiPAP well. Satting in the low 90s on the BiPAP.  Myrna Blazer, MD 10/25/15 1213

## 2015-10-25 NOTE — Procedures (Signed)
PROCEDURE NOTE: CVL PLACEMENT  INDICATION:    Monitoring of central venous pressures and/or administration of medications optimally administered in central vein  CONSENT:   Risks of procedure as well as the alternatives were explained to the patient or surrogate. Consent for procedure obtained. A time out was performed.   PROCEDURE  Sterile technique was used including antiseptics, cap, gloves, gown, hand hygiene, mask and full body sheet.  Skin prep: Chlorhexidine; local anesthetic administered  A triple lumen catheter was placed in the L IJ vein using the Seldinger technique.  Ultrasound was used for vessel identification and guidance.   EVALUATION:  Blood flow good  Complications: No apparent complications  Patient tolerated the procedure well.  CXR revealed proper position and no pneumothorax   Billy Fischeravid Norely Schlick, MD PCCM service Mobile [phone]

## 2015-10-25 NOTE — ED Notes (Signed)
MD at bedside at this time. Pt O2 55%, O2 probe placed on patient's ear, and 6L O2 via [place] at this time. Pt Sats 90% on 6L.

## 2015-10-25 NOTE — Progress Notes (Signed)
Assisted family to the ICU waiting area while doctors intubated the patient.  Also showed then where the cafeteria & vending machine were located as well as the coffee maker in the ICU waiting room.  Gave prescence and emotional support. Chaplain Ronney Liononna S Tilda Samudio Ext [phone]

## 2015-10-25 NOTE — H&P (Signed)
[facility] [place] at [facility]   PATIENT NAME: Theodore JacksMichael Reid    MR#:  [id number]  DATE OF BIRTH:  1986-06-28  DATE OF ADMISSION:  10/25/2015  PRIMARY CARE PHYSICIAN: Margaretann LovelessJennifer M Burnette, PA-C   REQUESTING/REFERRING PHYSICIAN: Dr Langston MaskerShaevitz  CHIEF COMPLAINT:  Shortness of breath  HISTORY OF PRESENT ILLNESS:  Theodore Reid  is a 29 y.o. male with a known history of morbid obesity, diastolic heart failure and gout who presents from [facility]'s office for acute on chronic diastolic heart failure. Patient was seen today for a follow-up visit. Patient has had increasing shortness of breath, dyspnea exertion and weight gain over the past 2 weeks.  Patient states he is compliant with his medications and diet. Patient was asked to come to the emergency room for evaluation as his symptoms have worsened. In the emergency room he appears very short of breath, tachypnea and uncomfortable. I have asked the ER nurse to place patient on BiPAP as he was satting 88% on 6 L. He will be given 80 IV of Lasix.  PAST MEDICAL HISTORY:   Past Medical History  Diagnosis Date  . Morbid obesity (HCC)   . Gout   . History of echocardiogram     a. a. echo 04/15/2015: EF 60-65%, no WMA, LA nl in size, RV poorly visualized, PASP nl, grossly nl study, challening study 2/2 body habitus   . CHF (congestive heart failure) (HCC)     PAST SURGICAL HISTORY:   Past Surgical History  Procedure Laterality Date  . Tonsillectomy      SOCIAL HISTORY:   Social History  Substance Use Topics  . Smoking status: Former Smoker -- 1.00 packs/day for 11 years    Types: Cigarettes    Quit date: 04/14/2015  . Smokeless tobacco: Never Used  . Alcohol Use: No    FAMILY HISTORY:   Family History  Problem Relation Age of Onset  . Diabetes Mother   . CAD Mother   . Hypertension Mother   . Diabetes Father   . Cancer Father     rectal  . Diabetes Brother     DRUG ALLERGIES:  No Known  Allergies   REVIEW OF SYSTEMS:  CONSTITUTIONAL: No fever, positive fatigue and weakness   EYES: No blurred or double vision.  EARS, NOSE, AND THROAT: No tinnitus or ear pain.  RESPIRATORY: No cough,of shortness of breath and dyspnea exertion   CARDIOVASCULAR: No chest pain, orthopnea,  positive for lower extremity edema and abdominal edema.  GASTROINTESTINAL: No nausea, vomiting, diarrhea or abdominal pain.  GENITOURINARY: No dysuria, hematuria.  ENDOCRINE: No polyuria, nocturia,  HEMATOLOGY: No anemia, easy bruising or bleeding SKIN: No rash or lesion. MUSCULOSKELETAL: No joint pain or arthritis.   positive morbid obesity NEUROLOGIC: No tingling, numbness, weakness.  PSYCHIATRY: No anxiety or depression.   MEDICATIONS AT HOME:   Prior to Admission medications   Medication Sig Start Date End Date Taking? Authorizing Provider  acetaminophen (TYLENOL) 325 MG tablet Take 650 mg by mouth every 6 (six) hours as needed for headache.   Yes Historical Provider, MD  albuterol (PROVENTIL HFA;VENTOLIN HFA) 108 (90 BASE) MCG/ACT inhaler Inhale 2 puffs into the lungs every 4 (four) hours as needed for wheezing or shortness of breath. 04/17/15  Yes Ramonita LabAruna Gouru, MD  allopurinol (ZYLOPRIM) 100 MG tablet Take 1 tablet (100 mg total) by mouth daily. 06/18/15  Yes Margaretann LovelessJennifer M Burnette, PA-C  aspirin EC 81 MG tablet Take 1 tablet (81  mg total) by mouth daily. 04/17/15  Yes Ramonita Lab, MD  furosemide (LASIX) 20 MG tablet Take 1 tablet (20 mg total) by mouth 2 (two) times daily. 05/14/15  Yes Delma Freeze, FNP      VITAL SIGNS:  Blood pressure 123/71, pulse 108, temperature 98.7 F (37.1 C), temperature source Oral, resp. rate 28, height  (1.803 m), weight 285.766 kg (630 lb), SpO2 93 %.  PHYSICAL EXAMINATION:  GENERAL:  30 y.o.-year-old patient sitting up appears uncomfortable with acute respiratory distress IPAP placed.   EYES: Pupils equal, round, reactive to light and accommodation. No scleral  icterus. Extraocular muscles intact.  HEENT: Head atraumatic, normocephalic. patient has BiPAP placed  NECK:  Supple, no jugular venous distention. neck is too short to appreciate and large thyroid  LUNGS: due to his body habitus I am unable to hear good lung sounds   CARDIOVASCULAR:  his heart sounds are very distant ABDOMEN: he is morbidly obese and hard to appreciate organomegaly due to body habitus. According to his mother he has gained a lot of abdominal weight EXTREMITIES: 3+ edema no clubbing or cyanosis.  NEUROLOGIC: Cranial nerves II through XII are grossly intact. No focal deficits. PSYCHIATRIC: The patient is alert and oriented x 3.  SKIN: No obvious rash, lesion, or ulcer.   LABORATORY PANEL:   CBC White blood cell 11.7 hemoglobin 14.7 hematocrit 50 platelets 187 ------------------------------------------------------------------------------------------------------------------  Chemistries  Sodium 137 potassium 3.7 Court 97 bicarbonate 34 creatinine 1.59 glucose 102 BUN 49 ------------------------------------------------------------------------------------------------------------------  Cardiac Enzymes No results for input(s): TROPONINI in the last 168 hours. ------------------------------------------------------------------------------------------------------------------  RADIOLOGY:  No results found.  EKG:   Normal sinus rhythm no ST elevation or depression   IMPRESSION AND PLAN:    This is a 29 year old male with a history of diastolic heart failure and sleep apnea who presents with acute on chronic diastolic heart failure.    1. Acute hypoxic respiratory failure: This is secondary to acute on chronic diastolic heart failure. Patient is currently on BiPAP machine which she will need to continue for today.   2. Acute on chronic diastolic heart failure: This is thought to be secondary to underlying obstructive sleep apnea. Patient has over 100 pound weight gain over  the past 2 weeks. I have prescribed 80 mg IV 3 times a day of Lasix here in he may better benefit from a continuous Lasix drip, therefore I have asked cardiology to see Theodore Reid to help [place] decide. Continue monitoring input and output.  3. Obstructive sleep apnea: This is a presumed diagnosis. Patient has had 1 out of 2 sleep studies. Patient will likely need CPAP machine at night while he he is here. He has been seen by Dr. Welton Flakes in the past.  4. Morbid obesity: Patient is encouraged to lose weight as tolerated and is here to diet and exercise.    All the records are reviewed and case discussed with ED provider. Management plans discussed with the patient and he is in agreement.  CODE STATUS: Full  CRITICAL CARE TOTAL TIME TAKING CARE OF THIS PATIENT: 55 minutes.  Patient at high risk for cardiopulmonary arrest.   Icholas Irby M.D on 10/25/2015 at 11:44 AM  Between 7am to 6pm - Pager - [phone] After 6pm go to [email or web address] - password [email or web address] [facility]  [place] [facility]  Office  [phone]  CC: Primary care physician; Margaretann Loveless, PA-C

## 2015-10-25 NOTE — Transfer of Care (Addendum)
Immediate Anesthesia Transfer of Care Note  Patient: Theodore Reid  Procedure(s) Performed: Emergent intubation  Patient Location: PACU and ICU  Anesthesia Type:General  Level of Consciousness: obtunded  Airway & Oxygen Therapy: non-rebreather face mask  Post-op Assessment: Report given to RN  Post vital signs: Reviewed  Last Vitals:  Filed Vitals:   10/25/15 1840 10/25/15 1900  BP:  142/91  Pulse: 97 95  Temp:    Resp: 0 28    Complications: No apparent anesthesia complications

## 2015-10-26 ENCOUNTER — Inpatient Hospital Stay (HOSPITAL_COMMUNITY)
Admit: 2015-10-26 | Discharge: 2015-10-26 | Disposition: A | Discharge status: Home / Self Care | Payer: Medicaid Other | Attending: Pulmonary Disease | Admitting: Pulmonary Disease

## 2015-10-26 ENCOUNTER — Inpatient Hospital Stay: Payer: Medicaid Other

## 2015-10-26 DIAGNOSIS — R0600 Dyspnea, unspecified: Secondary | ICD-10-CM

## 2015-10-26 DIAGNOSIS — R600 Localized edema: Secondary | ICD-10-CM | POA: Insufficient documentation

## 2015-10-26 DIAGNOSIS — I5033 Acute on chronic diastolic (congestive) heart failure: Secondary | ICD-10-CM

## 2015-10-26 DIAGNOSIS — R0902 Hypoxemia: Secondary | ICD-10-CM

## 2015-10-26 LAB — COMPREHENSIVE METABOLIC PANEL
ALT: 147 U/L — ABNORMAL HIGH (ref 17–63)
AST: 85 U/L — ABNORMAL HIGH (ref 15–41)
Albumin: 2.8 g/dL — ABNORMAL LOW (ref 3.5–5.0)
Alkaline Phosphatase: 56 U/L (ref 38–126)
Anion gap: 9 (ref 5–15)
BUN: 45 mg/dL — ABNORMAL HIGH (ref 6–20)
CO2: 41 mmol/L — ABNORMAL HIGH (ref 22–32)
Calcium: 8.3 mg/dL — ABNORMAL LOW (ref 8.9–10.3)
Chloride: 94 mmol/L — ABNORMAL LOW (ref 101–111)
Creatinine, Ser: 1.42 mg/dL — ABNORMAL HIGH (ref 0.61–1.24)
GFR calc Af Amer: >60 mL/min (ref >60)
GFR calc non Af Amer: >60 mL/min (ref >60)
Glucose, Bld: 85 mg/dL (ref 65–99)
Potassium: 2.6 mmol/L — CL (ref 3.5–5.1)
Sodium: 144 mmol/L (ref 135–145)
Total Bilirubin: 1.4 mg/dL — ABNORMAL HIGH (ref 0.3–1.2)
Total Protein: 6.5 g/dL (ref 6.5–8.1)

## 2015-10-26 LAB — CBC
HCT: 47.6 % (ref 40.0–52.0)
HEMOGLOBIN: 14.2 g/dL (ref 13.0–18.0)
MCH: 21.1 pg — AB (ref 26.0–34.0)
MCHC: 29.8 g/dL — ABNORMAL LOW (ref 32.0–36.0)
MCV: 70.7 fL — AB (ref 80.0–100.0)
PLATELETS: 153 10*3/uL (ref 150–440)
RBC: 6.73 MIL/uL — AB (ref 4.40–5.90)
RDW: 20.9 % — ABNORMAL HIGH (ref 11.5–14.5)
WBC: 10.8 10*3/uL — AB (ref 3.8–10.6)

## 2015-10-26 LAB — HEPARIN LEVEL (UNFRACTIONATED)
HEPARIN UNFRACTIONATED: 0.51 [IU]/mL (ref 0.30–0.70)
HEPARIN UNFRACTIONATED: 0.49 [IU]/mL (ref 0.30–0.70)

## 2015-10-26 LAB — MAGNESIUM
MAGNESIUM: 1.3 mg/dL — AB (ref 1.7–2.4)
Magnesium: 2 mg/dL (ref 1.7–2.4)

## 2015-10-26 LAB — APTT: aPTT: 23 seconds — ABNORMAL LOW (ref 24–36)

## 2015-10-26 LAB — PROCALCITONIN
Procalcitonin: 0.21 ng/mL
Procalcitonin: 0.18 ng/mL

## 2015-10-26 LAB — PROTIME-INR
INR: 1.3
PROTHROMBIN TIME: 16.3 s — AB (ref 11.4–15.0)

## 2015-10-26 LAB — GLUCOSE, CAPILLARY
GLUCOSE-CAPILLARY: 66 mg/dL (ref 65–99)
Glucose-Capillary: 92 mg/dL (ref 65–99)
Glucose-Capillary: 95 mg/dL (ref 65–99)
Glucose-Capillary: 102 mg/dL — ABNORMAL HIGH (ref 65–99)

## 2015-10-26 LAB — PHOSPHORUS: PHOSPHORUS: 5.3 mg/dL — AB (ref 2.5–4.6)

## 2015-10-26 LAB — POTASSIUM: Potassium: 2.8 mmol/L — CL (ref 3.5–5.1)

## 2015-10-26 MED ORDER — PRO-STAT SUGAR FREE PO LIQD
30.0000 mL | ORAL | Status: DC
  Administered 2015-10-26 – 2015-10-30 (×24): 30 mL via ORAL

## 2015-10-26 MED ORDER — POTASSIUM CHLORIDE 10 MEQ/50ML IV SOLN
10.0000 meq | INTRAVENOUS | Status: AC
  Administered 2015-10-26 – 2015-10-27 (×6): 10 meq via INTRAVENOUS
  Filled 2015-10-26 (×6): qty 50

## 2015-10-26 MED ORDER — PNEUMOCOCCAL VAC POLYVALENT 25 MCG/0.5ML IJ INJ: 0.5000 mL | INJECTION | INTRAMUSCULAR | Status: DC | PRN

## 2015-10-26 MED ORDER — VITAL HIGH PROTEIN PO LIQD
1000.0000 mL | ORAL | Status: DC
  Administered 2015-10-26: 1000 mL

## 2015-10-26 MED ORDER — DEXTROSE 50 % IV SOLN
INTRAVENOUS | Status: AC
  Filled 2015-10-26: qty 50

## 2015-10-26 MED ORDER — POTASSIUM CHLORIDE 10 MEQ/50ML IV SOLN: 10.0000 meq | INTRAVENOUS | Status: DC

## 2015-10-26 MED ORDER — VITAL HIGH PROTEIN PO LIQD: 1000.0000 mL | ORAL | Status: DC

## 2015-10-26 MED ORDER — DEXTROSE 50 % IV SOLN
12.5000 g | INTRAVENOUS | Status: DC | PRN
  Administered 2015-10-26: 12.5 g via INTRAVENOUS

## 2015-10-26 MED ORDER — HEPARIN (PORCINE) IN NACL 100-0.45 UNIT/ML-% IJ SOLN
1800.0000 [IU]/h | INTRAMUSCULAR | Status: DC
  Administered 2015-10-26 – 2015-10-30 (×8): 2400 [IU]/h via INTRAVENOUS
  Administered 2015-10-30: 1950 [IU]/h via INTRAVENOUS
  Filled 2015-10-26 (×15): qty 250

## 2015-10-26 MED ORDER — HEPARIN BOLUS VIA INFUSION
7500.0000 [IU] | Freq: Once | INTRAVENOUS | Status: AC
  Administered 2015-10-26: 7500 [IU] via INTRAVENOUS
  Filled 2015-10-26: qty 7500

## 2015-10-26 MED ORDER — SODIUM CHLORIDE 0.9 % IV SOLN
INTRAVENOUS | Status: AC
  Administered 2015-10-26 (×6): via INTRAVENOUS
  Filled 2015-10-26 (×6): qty 50

## 2015-10-26 MED ORDER — INFLUENZA VAC SPLIT QUAD 0.5 ML IM SUSY: 0.5000 mL | PREFILLED_SYRINGE | INTRAMUSCULAR | Status: DC | PRN

## 2015-10-26 MED ORDER — MAGNESIUM SULFATE 4 GM/100ML IV SOLN
4.0000 g | Freq: Once | INTRAVENOUS | Status: AC
  Administered 2015-10-26: 4 g via INTRAVENOUS
  Filled 2015-10-26: qty 100

## 2015-10-26 MED ORDER — FUROSEMIDE 10 MG/ML IJ SOLN
60.0000 mg | Freq: Two times a day (BID) | INTRAMUSCULAR | Status: DC
  Administered 2015-10-26 – 2015-10-27 (×2): 60 mg via INTRAVENOUS
  Filled 2015-10-26 (×3): qty 6

## 2015-10-26 MED ORDER — POTASSIUM CHLORIDE CRYS ER 20 MEQ PO TBCR
40.0000 meq | EXTENDED_RELEASE_TABLET | Freq: Once | ORAL | Status: DC
  Filled 2015-10-26: qty 2

## 2015-10-26 NOTE — Progress Notes (Signed)
[facility] Critical Care Medicine Progess Note    ASSESSMENT/PLAN   This is a 29 year old male with super obesity, congestive heart failure, obesity hypoventilation syndrome. He presents with acute respiratory failure with severe hypoxia, thought to be secondary to acute CHF exacerbation.  PULMONARY A: Acute on chronic hypoxic and hypercarbic respiratory failure Acute pulmonary edema secondary to acute diastolic congestive heart failure. OHS/OSA Smoker Bronchospasm Refractory hypoxemia - concern for R>L shunt, R/O PE Pulm edema pattern vs ALI/ARDS on CXR P:  Continue diuresis. Will check VQ scan. LE venous US studies ordered Vent settings established, continue high PEEP. Vent bundle implemented Daily SBT as indicated Scheduled nebulized steroids Scheduled and PRN nebulized bronchodilators Empiric full dose heparin until VTE ruled out   CARDIOVASCULAR A:  Diastolic HF  P:  CVP not likely to be helpful MAP goal > 65 mmHg TTE with bubble contrast ordered 11/28  RENAL A:  Hypervolemia, likely chronic AKI (baseline Cr 0.84) P:  Monitor BMET intermittently. Monitor I/Os Correct electrolytes as indicated. Continue diuresis.  GASTROINTESTINAL A:  Morbid obesity P:  SUP: IV famotidine Consider nutrition 11/29 (would favor intentional underfeeding @ 80% calculated caloric need)  HEMATOLOGIC A:  No issues P:  DVT px: full dose heparin until VTE R/O'd Monitor CBC intermittently Transfuse per usual guidelines  INFECTIOUS A:  No identified infections P:  Monitor temp, WBC count Micro and abx as above PCT protocol ordered 11/28  Sputum culture 10/26/2015; pending   ENDOCRINE A:  Mild hyperglycemia without documented history of DM P:  Monitor glu on chem panels Consider SSI for glu > 180  NEUROLOGIC A:  Acute encephalopathy of critical illness Intermittent agitation with ventilator dyssynchrony P:  RASS goal: -2,  -3 Propofol gtt PRN fentanyl PRN vecuronium for vent dyssynch   Chest x-ray 10/26/2015; mild pulmonary edema, morbid obesity. ---------------------------------------   ----------------------------------------   Name: Loma NewtonMichael J Remmert MRN: [id number] DOB: Feb 19, 1986    ADMISSION DATE:  10/25/2015    SUBJECTIVE/review of systems:   Pt currently on the ventilator, can not provide history or review of systems.     VITAL SIGNS: Temp:  [97.5 F (36.4 C)-98.8 F (37.1 C)] 98.6 F (37 C) (11/29 0700) Pulse Rate:  [60-115] 86 (11/29 0800) Resp:  [0-50] 18 (11/29 0800) BP: (73-146)/(50-103) 127/80 mmHg (11/29 0800) SpO2:  [69 %-98 %] 95 % (11/29 0800) FiO2 (%):  [90 %-100 %] 100 % (11/29 0744) Weight:  [266.8 kg (588 lb 3 oz)-285.766 kg (630 lb)] 266.8 kg (588 lb 3 oz) (11/29 0419) HEMODYNAMICS:   VENTILATOR SETTINGS: Vent Mode:  [-] PRVC FiO2 (%):  [90 %-100 %] 100 % Set Rate:  [18 bmp] 18 bmp Vt Set:  [600 mL] 600 mL PEEP:  [10 cmH20-12 cmH20] 12 cmH20 INTAKE / OUTPUT:  Intake/Output Summary (Last 24 hours) at 10/26/15 0850 Last data filed at 10/26/15 0643  Gross per 24 hour  Intake 582.61 ml  Output   9025 ml  Net -8442.39 ml    PHYSICAL EXAMINATION: Physical Examination:   VS: BP 127/80 mmHg  Pulse 86  Temp(Src) 98.6 F (37 C) (Oral)  Resp 18  Ht 5\' 11"  (1.803 m)  Wt 266.8 kg (588 lb 3 oz)  BMI 82.07 kg/m2  SpO2 95%  General Appearance: On ventilator Neuro:without focal findings, mental status normal. HEENT: PERRLA, EOM intact. Pulmonary: normal breath sounds   CardiovascularNormal S1,S2.  No m/r/g.   Abdomen: Benign, Soft, non-tender. Renal:  No costovertebral tenderness  GU:  Not performed  at this time. Endocrine: No evident thyromegaly. Skin:   warm, no rashes, no ecchymosis  Extremities: normal, no cyanosis, clubbing.   LABS:   LABORATORY PANEL:   CBC  Recent Labs Lab 10/26/15 0646  WBC 10.8*  HGB 14.2  HCT 47.6  PLT 153     Chemistries   Recent Labs Lab 10/26/15 0646  NA 144  K 2.6*  CL 94*  CO2 41*  GLUCOSE 85  BUN 45*  CREATININE 1.42*  CALCIUM 8.3*  AST 85*  ALT 147*  ALKPHOS 56  BILITOT 1.4*     Recent Labs Lab 10/25/15 1721  GLUCAP 87    Recent Labs Lab 10/25/15 1155 10/25/15 1859 10/25/15 2100  PHART 7.34* 7.29* 7.32*  PCO2ART 68* 79* 71*  PO2ART 64* 62* 56*    Recent Labs Lab 10/26/15 0646  AST 85*  ALT 147*  ALKPHOS 56  BILITOT 1.4*  ALBUMIN 2.8*    Cardiac Enzymes  Recent Labs Lab 10/25/15 1138  TROPONINI <0.03    RADIOLOGY:  Dg Chest 1 View  10/25/2015  CLINICAL DATA:  Tube placements EXAM: CHEST 1 VIEW COMPARISON:  10/25/2015 at 11:09 FINDINGS: Endotracheal tube tip is 6.4 cm above the carina. Left jugular central line reaches the expected location of the SVC, probably with its tip just below the azygos vein junction. Orogastric tube is not conclusively visible. No pneumothorax is evident. Basilar opacities persist bilaterally. Significant study limitations due to patient body habitus and portable technique. IMPRESSION: ET tube and left jugular central line appear satisfactorily positioned. OG tube not visible. Limited study. Electronically Signed   By: Ellery Plunk M.D.   On: 10/25/2015 22:27   Dg Chest 1 View  10/25/2015  CLINICAL DATA:  Pulmonary edema. EXAM: CHEST 1 VIEW COMPARISON:  04/16/2015 FINDINGS: Technically limited study due to morbid obesity and portable technique. Cardiac enlargement with vascular congestion. No definite edema or effusion. Bibasilar atelectasis. IMPRESSION: Limited study. Probable mild fluid overload. Two-view chest x-ray suggested for better quality images. Electronically Signed   By: Marlan Palau M.D.   On: 10/25/2015 11:45   Dg Abd 1 View  10/25/2015  CLINICAL DATA:  Orogastric tube placement EXAM: ABDOMEN - 1 VIEW COMPARISON:  None. FINDINGS: No orogastric tube is identified. Due to poor radiographic technique,  the bowel gas cannot be assessed. IMPRESSION: No orogastric tube identified. Electronically Signed   By: Elige Ko   On: 10/25/2015 22:22   Dg Chest Port 1 View  10/26/2015  CLINICAL DATA:  Respiratory failure EXAM: PORTABLE CHEST 1 VIEW COMPARISON:  10/17/2015 FINDINGS: Cardiomegaly again noted. Study is markedly limited by patient's large body habitus and poor inspiration. Persistent central vascular congestion mild interstitial prominence bilateral suspicious for pulmonary edema. Bilateral basilar atelectasis or infiltrate again noted. Stable endotracheal tube position. IMPRESSION: Study is markedly limited by patient's large body habitus and poor inspiration. Persistent central vascular congestion mild interstitial prominence bilateral suspicious for pulmonary edema. Bilateral basilar atelectasis or infiltrate again noted. Stable endotracheal tube position. Electronically Signed   By: Natasha Mead M.D.   On: 10/26/2015 08:04       --Wells Guiles, MD.  [facility]   Santiago Glad, M.D.  Stephanie Acre, M.D.  Billy Fischer, M.D   Critical Care Attestation.  I have personally obtained a history, examined the patient, evaluated laboratory and imaging results, formulated the assessment and plan and placed orders. The Patient requires high complexity decision making for assessment and support, frequent evaluation and  titration of therapies, application of advanced monitoring technologies and extensive interpretation of multiple databases. The patient has critical illness that could lead imminently to failure of 1 or more organ systems and requires the highest level of physician preparedness to intervene.  Critical Care Time devoted to patient care services described in this note is 35 minutes and is exclusive of time spent in procedures.

## 2015-10-26 NOTE — Progress Notes (Signed)
ANTICOAGULATION CONSULT NOTE - Initial Consult  Pharmacy Consult for heparin drip Indication: VTE treatment  No Known Allergies  Patient Measurements: Height: 5\' 11"  (180.3 cm) Weight: (!) 588 lb 3 oz (266.8 kg) IBW/kg (Calculated) : 75.3 Heparin Dosing Weight: 151kg  Vital Signs: Temp: 98.4 F (36.9 C) (11/29 1945) Temp Source: Axillary (11/29 1945) BP: 117/68 mmHg (11/29 1800) Pulse Rate: 86 (11/29 1800)  Labs:  Recent Labs  10/25/15 1138 10/26/15 0212 10/26/15 0646 10/26/15 1110 10/26/15 1933  HGB 14.7  --  14.2  --   --   HCT 49.5  --  47.6  --   --   PLT 187  --  153  --   --   APTT  --  23*  --   --   --   LABPROT  --  16.3*  --   --   --   INR  --  1.30  --   --   --   HEPARINUNFRC  --   --   --  0.51 0.49  CREATININE 1.59*  --  1.42*  --   --   TROPONINI <0.03  --   --   --   --     Estimated Creatinine Clearance: 164.9 mL/min (by C-G formula based on Cr of 1.42).   Medical History: Past Medical History  Diagnosis Date  . Morbid obesity (HCC)   . Gout   . History of echocardiogram     a. a. echo 04/15/2015: EF 60-65%, no WMA, LA nl in size, RV poorly visualized, PASP nl, grossly nl study, challening study 2/2 body habitus   . CHF (congestive heart failure) (HCC)     Medications:    Assessment: [name] with morbid obesity and diastolic HF ordered anticoagulation for r/o PE.   Goal of Therapy:  Heparin level 0.3-0.7 units/ml Monitor platelets by anticoagulation protocol: Yes   Plan:  Heparin level is at goal at 0.49 so will continue heparin infusion at 2400 units/hr. Will check a HL and CBC with am labs.   Clovia CuffLisa Wenonah Milo, PharmD, BCPS 10/26/2015 9:15 PM

## 2015-10-26 NOTE — Progress Notes (Signed)
Dr Nicholos Johnsamachandran notified of pt's low K of 2.6, new orders obtained for replacements.

## 2015-10-26 NOTE — Progress Notes (Signed)
eLink Physician-Brief Progress Note Patient Name: Theodore NewtonMichael J Reid DOB: 08/14/1986 MRN: [id number]   Date of Service  10/26/2015  HPI/Events of Note  PO2 56 on PEEP 10  eICU Interventions  Increase PEEP to 12     Intervention Category Major Interventions: Hypoxemia - evaluation and management  ALVA,RAKESH V. 10/26/2015, 12:50 AM

## 2015-10-26 NOTE — Progress Notes (Signed)
Sputum culture sent to lab

## 2015-10-26 NOTE — Progress Notes (Signed)
ANTICOAGULATION CONSULT NOTE - Initial Consult  Pharmacy Consult for heparin drip Indication: VTE treatment  No Known Allergies  Patient Measurements: Height: 5\' 11"  (180.3 cm) Weight: (!) 588 lb 3 oz (266.8 kg) IBW/kg (Calculated) : 75.3 Heparin Dosing Weight: 151kg  Vital Signs: Temp: 98.6 F (37 C) (11/29 0700) Temp Source: Oral (11/29 0700) BP: 111/76 mmHg (11/29 1000) Pulse Rate: 88 (11/29 0900)  Labs:  Recent Labs  10/25/15 1138 10/26/15 0212 10/26/15 0646 10/26/15 1110  HGB 14.7  --  14.2  --   HCT 49.5  --  47.6  --   PLT 187  --  153  --   APTT  --  23*  --   --   LABPROT  --  16.3*  --   --   INR  --  1.30  --   --   HEPARINUNFRC  --   --   --  0.51  CREATININE 1.59*  --  1.42*  --   TROPONINI <0.03  --   --   --     Estimated Creatinine Clearance: 164.9 mL/min (by C-G formula based on Cr of 1.42).   Medical History: Past Medical History  Diagnosis Date  . Morbid obesity (HCC)   . Gout   . History of echocardiogram     a. a. echo 04/15/2015: EF 60-65%, no WMA, LA nl in size, RV poorly visualized, PASP nl, grossly nl study, challening study 2/2 body habitus   . CHF (congestive heart failure) (HCC)     Medications:    Assessment: [name] with morbid obesity and diastolic HF ordered anticoagulation for r/o PE.   Goal of Therapy:  Heparin level 0.3-0.7 units/ml Monitor platelets by anticoagulation protocol: Yes   Plan:  Heparin level is at goal at 0.51 so will continue heparin infusion at 2400 units/hr. Will check a confirmatory HL in 6 hours.   Luisa Harthristy, Kelita Wallis D 10/26/2015,11:46 AM

## 2015-10-26 NOTE — Progress Notes (Signed)
ANTICOAGULATION CONSULT NOTE - Initial Consult  Pharmacy Consult for heparin drip Indication: VTE treatment  No Known Allergies  Patient Measurements: Height: 5\' 11"  (180.3 cm) Weight: (!) 588 lb 3 oz (266.8 kg) IBW/kg (Calculated) : 75.3 Heparin Dosing Weight: 151kg  Vital Signs: Temp: 97.5 F (36.4 C) (11/28 2230) Temp Source: Axillary (11/28 2230) BP: 134/79 mmHg (11/29 0400) Pulse Rate: 89 (11/29 0300)  Labs:  Recent Labs  10/25/15 1138 10/26/15 0212  HGB 14.7  --   HCT 49.5  --   PLT 187  --   APTT  --  23*  LABPROT  --  16.3*  INR  --  1.30  CREATININE 1.59*  --   TROPONINI <0.03  --     Estimated Creatinine Clearance: 147.3 mL/min (by C-G formula based on Cr of 1.59).   Medical History: Past Medical History  Diagnosis Date  . Morbid obesity (HCC)   . Gout   . History of echocardiogram     a. a. echo 04/15/2015: EF 60-65%, no WMA, LA nl in size, RV poorly visualized, PASP nl, grossly nl study, challening study 2/2 body habitus   . CHF (congestive heart failure) (HCC)     Medications:    Assessment: Hgb 14.67  plt 187 INR 1.3  aPTT 23  Goal of Therapy:  Heparin level 0.3-0.7 units/ml Monitor platelets by anticoagulation protocol: Yes   Plan:  7500 unit bolus and initial rate of 2400 units/hr. Check first anti-Xa 6 hours after start of infusion.  Bonnie Overdorf S 10/26/2015,6:20 AM

## 2015-10-26 NOTE — Consult Note (Signed)
Cardiology Consultation Note  Patient ID: Theodore Reid, MRN: [id number], DOB/AGE: November 07, 1986 29 y.o. Admit date: 10/25/2015   Date of Consult: 10/26/2015 Primary Physician: Margaretann Loveless, PA-C Primary Cardiologist: Dr. Mariah Milling, MD  Chief Complaint: SOB Reason for Consult: Acute on chronic diastolic CHF  HPI: 29 y.o. male with h/o chronic diastolic CHF, morbid obesity, hypoventilation syndrome, and sleep apnea who presented to [facility] on 11/28 with increased SOB.     He was previously in the hospital back in May of 2016 for increased SOB and leg swelling. Echo showed EF 60-65%, normal wall motion, normal right sided pressure. Symptoms improved with oxygen and diuresis. Weight at that time was mid 500 pound range. In hospital follow up in May of 2016 he was less swollen on lasix. With ambulation oxygen saturations were in the 90's with walking, at rest back to the 80's, and improved to 94% with deep inspiration. He has been followed by the CHF clinic as of late with continued weight gain. No medication changes have been made over visits. He was last seen on 9/23 weighing 589 and again on 11/22 and 11/28 weighing 628. On 11/28 he was noted to have increased SOB and was advised to come to the ED for IV diuresis.     Upon the patient's arrival to [facility] they were found to have BNP of 934, troponin negative x 1, SCr 1.59, pCO2 of 68, pO2 of 64, WBC 11.7, K+ 3.7-->2.6. ECG showed sinus tachycardia, 109 bpm, low voltage, poor R wave progression, anteroseptal infarct age undetermined , CXR showed probable mild fluid overload. Follow up CXR on 11/29 showed persistent central vascular congestion with likley pulmonary edema. He was started on Lasix 80 mg q 6 hours with potassium repletion. Thus far for the admission he is minus 82gg mL for the admission and 8442 mL for the past 24 hours. He remains intubated on 100% FiO2 2/2 respiratory distress. O2 sats remain at 91%. He is on heparin gtt for possible PE. VQ  scan is ordered, but not completed at this time given his acuity. Echo is pending.     Past Medical History  Diagnosis Date  . Morbid obesity (HCC)   . Gout   . History of echocardiogram     a. a. echo 04/15/2015: EF 60-65%, no WMA, LA nl in size, RV poorly visualized, PASP nl, grossly nl study, challening study 2/2 body habitus   . CHF (congestive heart failure) (HCC)       Most Recent Cardiac Studies: Echo 04/15/2015: Study Conclusions  - Left ventricle: The cavity size was normal. There was mild concentric hypertrophy. Systolic function was normal. The estimated ejection fraction was in the range of 60% to 65%. Wall motion was normal; there were no regional wall motion abnormalities. - Left atrium: The atrium was normal in size. - Right ventricle: Poorly visualized. Systolic function was normal. - Pulmonary arteries: Systolic pressure was within the normal range.  Impressions:  - Grossly a normal study. Challenging image quality secondary to body habitus.   Surgical History:  Past Surgical History  Procedure Laterality Date  . Tonsillectomy       Home Meds: Prior to Admission medications   Medication Sig Start Date End Date Taking? Authorizing Provider  acetaminophen (TYLENOL) 325 MG tablet Take 650 mg by mouth every 6 (six) hours as needed for headache.   Yes Historical Provider, MD  albuterol (PROVENTIL HFA;VENTOLIN HFA) 108 (90 BASE) MCG/ACT inhaler Inhale 2 puffs into the lungs  every 4 (four) hours as needed for wheezing or shortness of breath. 04/17/15  Yes Ramonita Lab, MD  allopurinol (ZYLOPRIM) 100 MG tablet Take 1 tablet (100 mg total) by mouth daily. 06/18/15  Yes Margaretann Loveless, PA-C  aspirin EC 81 MG tablet Take 1 tablet (81 mg total) by mouth daily. 04/17/15  Yes Ramonita Lab, MD  furosemide (LASIX) 20 MG tablet Take 1 tablet (20 mg total) by mouth 2 (two) times daily. 05/14/15  Yes Delma Freeze, FNP    Inpatient Medications:  . allopurinol   100 mg Per Tube Daily  . antiseptic oral rinse  7 mL Mouth Rinse q12n4p  . aspirin  325 mg Per Tube Daily  . budesonide  0.25 mg Nebulization Q6H  . chlorhexidine  15 mL Mouth Rinse BID  . famotidine (PEPCID) IV  20 mg Intravenous Q12H  . feeding supplement (VITAL HIGH PROTEIN)  1,000 mL Per Tube Q24H  . furosemide  80 mg Intravenous Q6H  . Influenza vac split quadrivalent PF  0.5 mL Intramuscular Tomorrow-1000  . ipratropium-albuterol  3 mL Nebulization Q6H  . pneumococcal 23 valent vaccine  0.5 mL Intramuscular Tomorrow-1000  . potassium chloride 10 mEq in sodium chloride 0.9% 50 mL *CENTRAL LINE* IVPB   Intravenous Q1 Hr x 6  . potassium chloride  40 mEq Oral Once  . sodium chloride  3 mL Intravenous Q12H   . heparin 2,400 Units/hr (10/26/15 0518)  . propofol (DIPRIVAN) infusion 40 mcg/kg/min (10/26/15 0825)    Allergies: No Known Allergies  Social History   Social History  . Marital Status: Single    Spouse Name: N/A  . Number of Children: N/A  . Years of Education: N/A   Occupational History  . Not on file.   Social History Main Topics  . Smoking status: Former Smoker -- 1.00 packs/day for 11 years    Types: Cigarettes    Quit date: 04/14/2015  . Smokeless tobacco: Never Used  . Alcohol Use: No  . Drug Use: No  . Sexual Activity: Not on file   Other Topics Concern  . Not on file   Social History Narrative   Lives at home with Mother.   Works in [employer] at [employer]     Family History  Problem Relation Age of Onset  . Diabetes Mother   . CAD Mother   . Hypertension Mother   . Diabetes Father   . Cancer Father     rectal  . Diabetes Brother      Review of Systems: Review of Systems  Unable to perform ROS: intubated    Labs:  Recent Labs  10/25/15 1138  TROPONINI <0.03   Lab Results  Component Value Date   WBC 10.8* 10/26/2015   HGB 14.2 10/26/2015   HCT 47.6 10/26/2015   MCV 70.7* 10/26/2015   PLT 153 10/26/2015      Recent Labs Lab 10/26/15 0646  NA 144  K 2.6*  CL 94*  CO2 41*  BUN 45*  CREATININE 1.42*  CALCIUM 8.3*  PROT 6.5  BILITOT 1.4*  ALKPHOS 56  ALT 147*  AST 85*  GLUCOSE 85   Lab Results  Component Value Date   TRIG 257* 10/25/2015   No results found for: DDIMER  Radiology/Studies:  Dg Chest 1 View  10/25/2015  CLINICAL DATA:  Tube placements EXAM: CHEST 1 VIEW COMPARISON:  10/25/2015 at 11:09 FINDINGS: Endotracheal tube tip is 6.4 cm above the carina. Left  jugular central line reaches the expected location of the SVC, probably with its tip just below the azygos vein junction. Orogastric tube is not conclusively visible. No pneumothorax is evident. Basilar opacities persist bilaterally. Significant study limitations due to patient body habitus and portable technique. IMPRESSION: ET tube and left jugular central line appear satisfactorily positioned. OG tube not visible. Limited study. Electronically Signed   By: Ellery Plunk M.D.   On: 10/25/2015 22:27   Dg Chest 1 View  10/25/2015  CLINICAL DATA:  Pulmonary edema. EXAM: CHEST 1 VIEW COMPARISON:  04/16/2015 FINDINGS: Technically limited study due to morbid obesity and portable technique. Cardiac enlargement with vascular congestion. No definite edema or effusion. Bibasilar atelectasis. IMPRESSION: Limited study. Probable mild fluid overload. Two-view chest x-ray suggested for better quality images. Electronically Signed   By: Marlan Palau M.D.   On: 10/25/2015 11:45   Dg Abd 1 View  10/25/2015  CLINICAL DATA:  Orogastric tube placement EXAM: ABDOMEN - 1 VIEW COMPARISON:  None. FINDINGS: No orogastric tube is identified. Due to poor radiographic technique, the bowel gas cannot be assessed. IMPRESSION: No orogastric tube identified. Electronically Signed   By: Elige Ko   On: 10/25/2015 22:22   Dg Chest Port 1 View  10/26/2015  CLINICAL DATA:  Respiratory failure EXAM: PORTABLE CHEST 1 VIEW COMPARISON:  10/17/2015  FINDINGS: Cardiomegaly again noted. Study is markedly limited by patient's large body habitus and poor inspiration. Persistent central vascular congestion mild interstitial prominence bilateral suspicious for pulmonary edema. Bilateral basilar atelectasis or infiltrate again noted. Stable endotracheal tube position. IMPRESSION: Study is markedly limited by patient's large body habitus and poor inspiration. Persistent central vascular congestion mild interstitial prominence bilateral suspicious for pulmonary edema. Bilateral basilar atelectasis or infiltrate again noted. Stable endotracheal tube position. Electronically Signed   By: Natasha Mead M.D.   On: 10/26/2015 08:04    EKG: sinus tachycardia, 109 bpm, low voltage, poor R wave progression, anteroseptal infarct age undetermined   Weights: Filed Weights   10/25/15 1042 10/25/15 1800 10/26/15 0419  Weight: 630 lb (285.766 kg) 627 lb 3.3 oz (284.5 kg) 588 lb 3 oz (266.8 kg)     Physical Exam: Blood pressure 127/80, pulse 86, temperature 98.6 F (37 C), temperature source Oral, resp. rate 18, height  (1.803 m), weight 588 lb 3 oz (266.8 kg), SpO2 95 %. Body mass index is 82.07 kg/(m^2). General: Well developed, well nourished, in no acute distress. Head: Normocephalic, atraumatic, sclera non-icteric, no xanthomas, nares are without discharge.  Neck: Negative for carotid bruits. JVD not elevated. Lungs: Decreased bilaterally. Breathing is unlabored. Heart: RRR with S1 S2. No murmurs, rubs, or gallops appreciated. Abdomen: Obese, soft, non-tender, non-distended with normoactive bowel sounds. No hepatomegaly. No rebound/guarding. No obvious abdominal masses. Msk:  Strength and tone appear normal for age. Extremities: No clubbing or cyanosis. No edema.  Distal pedal pulses are 2+ and equal bilaterally. Neuro: Alert and oriented X 3. No facial asymmetry. No focal deficit. Moves all extremities spontaneously. Psych:  Responds to questions  appropriately with a normal affect.    Assessment and Plan:   1. Acute hypoxic and hypercarbic respiratory failure: -Likely multifactorial in the setting of acute on chronic diastolic CHF and morbid obesity hypoventilation syndrome, though cannot rule out PE. His acuity precludes evaluation for PE at this time, thus he is being empirically treated for PE with heparin gtt -On 100% FiO2 he remains at 91% a peep of 14 oxygen sat of 91% -Wean O2  as able per PCCM -Diurese with IV Lasix 80 mg q 6 hours with KCl repletion to a goal of 4.0 -Nebs per respiratory  -If able evaluate for possible PE/DVT -Echo is pending to evaluate LV function, wall motion, and right-heart pressure  2. Acute on chronic diastolic CHF: -As above -Avoid BB in the setting of respiratory failure  3. Morbid obesity hypoventilation syndrome: -Per IM  4. Severe hypokalemia: -Currently being repleted    SignedEula Reid, Theodore Zech, PA-C Pager: [phone] 10/26/2015, 9:17 AM

## 2015-10-26 NOTE — Progress Notes (Signed)
Initial Nutrition Assessment     INTERVENTION:   EN: no OG or NG at present; RN plans to attempt to place tube today. Pt receiving significant calories from diprivan at current rate; recommend starting Vital High Protein at rate of 15 ml/hr (goal rate with current diprivan), Prostat q 4 hours to better meet protein needs. Will reassess on follow-up  NUTRITION DIAGNOSIS:   Inadequate oral intake related to acute illness as evidenced by NPO status.  GOAL:   Provide needs based on ASPEN/SCCM guidelines  MONITOR:    (Energy Intake, Pulmonary, Digestive System, Electrolyte/Renal Profile, Glucose Profile)  REASON FOR ASSESSMENT:   Ventilator, Consult Enteral/tube feeding initiation and management  ASSESSMENT:    Pt admitted with SOB, weight gain, LE swelling with acute on chronic CHF, worsening respiratory failure requiring intubation, currently sedated on vent  Past Medical History  Diagnosis Date  . Morbid obesity (HCC)   . Gout   . History of echocardiogram     a. a. echo 04/15/2015: EF 60-65%, no WMA, LA nl in size, RV poorly visualized, PASP nl, grossly nl study, challening study 2/2 body habitus   . CHF (congestive heart failure) (HCC)    Diet Order:   NPO  Digestive System: no OG or NG at present  Skin:  Reviewed, no issues  Last BM:  11/28   Meds: diprivan (1795 kcals in 24 hours at current rate), lasix  Electrolyte and Renal Profile:  Recent Labs Lab 10/25/15 1138 10/26/15 0646 10/26/15 1110  BUN 49* 45*  --   CREATININE 1.59* 1.42*  --   NA 137 144  --   K 3.7 2.6*  --   MG  --   --  1.3*  PHOS  --   --  5.3*   Glucose Profile:   Recent Labs  10/25/15 1721 10/26/15 1209  GLUCAP 87 66   Lipid Profile:     Component Value Date/Time   TRIG 257* 10/25/2015 2205    Protein Profile:   Recent Labs Lab 10/26/15 0646  ALBUMIN 2.8*    Height:   Ht Readings from Last 1 Encounters:  10/25/15 5\' 11"  (1.803 m)    Weight:   Wt Readings  from Last 1 Encounters:  10/26/15 588 lb 3 oz (266.8 kg)   BMI:  Body mass index is 82.07 kg/(m^2).  Wt Readings from Last 10 Encounters:  10/26/15 588 lb 3 oz (266.8 kg)  10/25/15 630 lb (285.766 kg)  10/19/15 628 lb (284.859 kg)  08/20/15 589 lb (267.169 kg)  08/03/15 593 lb (268.983 kg)  06/24/15 568 lb (257.643 kg)  06/18/15 561 lb (254.468 kg)  05/25/15 561 lb (254.468 kg)  05/14/15 564 lb (255.829 kg)  04/28/15 549 lb (249.025 kg)    Estimated Nutritional Needs:   Kcal:  1716-1950 kcals (22-25 kcals/kg) using IBW 78 kg  Protein:  156-195 g (2.0-2.5 g/kg)   Fluid:  1950-2340 mL (25-30 ml/kg)     HIGH Care Level  Romelle Starcherate Shin Lamour MS, RD, LDN [phone] Pager

## 2015-10-26 NOTE — Progress Notes (Signed)
eLink Physician-Brief Progress Note Patient Name: Theodore NewtonMichael J Reid DOB: 1986-10-25 MRN: [id number]   Date of Service  10/26/2015  HPI/Events of Note  K+ = 2.8, Mg++ = 2.0 and Creatinine = 1.42.  eICU Interventions  Will replete K+.     Intervention Category Intermediate Interventions: Electrolyte abnormality - evaluation and management  Lenell AntuSommer,Steven Eugene 10/26/2015, 9:33 PM

## 2015-10-26 NOTE — Progress Notes (Signed)
Patient continues on the ventilator with no changes on the settings. Drips continue as charted. Family has been at bedside and updated. Dr Renae GlossWieting and Dr Linward Natalamachandra have made rounds and updated. Lab results reported to MD and new orders carried out. No ss of distress noted.

## 2015-10-26 NOTE — Progress Notes (Signed)
Pharmacy Consult for Electrolyte Management  No Known Allergies  Patient Measurements: Height:  (180.3 cm) Weight: (!) 588 lb 3 oz (266.8 kg) IBW/kg (Calculated) : 75.3   Vital Signs: Temp: 98.4 F (36.9 C) (11/29 1945) Temp Source: Axillary (11/29 1945) BP: 123/72 mmHg (11/29 2100) Pulse Rate: 92 (11/29 2100) Intake/Output from previous day: 11/28 0701 - 11/29 0700 In: 582.6 [I.V.:582.6] Out: 9025 [Urine:9025] Intake/Output from this shift:    Labs:  Recent Labs  10/25/15 1138 10/26/15 0212 10/26/15 0646  WBC 11.7*  --  10.8*  HGB 14.7  --  14.2  HCT 49.5  --  47.6  PLT 187  --  153  APTT  --  23*  --   INR  --  1.30  --      Recent Labs  10/25/15 1138 10/25/15 2205 10/26/15 0646 10/26/15 1110 10/26/15 2029  NA 137  --  144  --   --   K 3.7  --  2.6*  --  2.8*  CL 97*  --  94*  --   --   CO2 34*  --  41*  --   --   GLUCOSE 102*  --  85  --   --   BUN 49*  --  45*  --   --   CREATININE 1.59*  --  1.42*  --   --   CALCIUM 8.1*  --  8.3*  --   --   MG  --   --   --  1.3* 2.0  PHOS  --   --   --  5.3*  --   PROT  --   --  6.5  --   --   ALBUMIN  --   --  2.8*  --   --   AST  --   --  85*  --   --   ALT  --   --  147*  --   --   ALKPHOS  --   --  56  --   --   BILITOT  --   --  1.4*  --   --   TRIG  --  257*  --   --   --    Estimated Creatinine Clearance: 164.9 mL/min (by C-G formula based on Cr of 1.42).    Recent Labs  10/26/15 1256 10/26/15 1550 10/26/15 1926  GLUCAP 92 95 102*    Medical History: Past Medical History  Diagnosis Date  . Morbid obesity (HCC)   . Gout   . History of echocardiogram     a. a. echo 04/15/2015: EF 60-65%, no WMA, LA nl in size, RV poorly visualized, PASP nl, grossly nl study, challening study 2/2 body habitus   . CHF (congestive heart failure) (HCC)     Medications:  Scheduled:  . allopurinol  100 mg Per Tube Daily  . antiseptic oral rinse  7 mL Mouth Rinse q12n4p  . aspirin  325 mg Per Tube  Daily  . budesonide  0.25 mg Nebulization Q6H  . chlorhexidine  15 mL Mouth Rinse BID  . famotidine (PEPCID) IV  20 mg Intravenous Q12H  . feeding supplement (PRO-STAT SUGAR FREE 64)  30 mL Oral 6 times per day  . furosemide  60 mg Intravenous Q12H  . ipratropium-albuterol  3 mL Nebulization Q6H  . potassium chloride  10 mEq Intravenous Q1 Hr x 6  . potassium chloride  40 mEq Oral Once  .  sodium chloride  3 mL Intravenous Q12H   Infusions:  . feeding supplement (VITAL HIGH PROTEIN) 1,000 mL (10/26/15 1302)  . heparin 2,400 Units/hr (10/26/15 1132)  . propofol (DIPRIVAN) infusion 40 mcg/kg/min (10/26/15 2058)     Assessment: Pharmacy consulted to assist in managing electrolytes in this [name] with diastolic HF and morbid obesity.   Plan:  Potassium is low at 2.8 and being replaced by MD with 6 runs of 10 meq IV. Will recheck potassium level with am labs. Magnesium is WNL at 2.0.   Clovia CuffLisa Carlo Guevarra, PharmD, BCPS 10/26/2015 9:47 PM

## 2015-10-26 NOTE — Progress Notes (Signed)
Patient ID: Theodore Reid, male   DOB: 10-26-1986, 29 y.o.   MRN: [id number] [facility] PROGRESS NOTE  PCP: Margaretann Loveless, PA-C  HPI/Subjective: Patient intubated and sedated.  Objective: Filed Vitals:   10/26/15 1100 10/26/15 1200  BP: 118/68 115/67  Pulse: 86 87  Temp:    Resp: 18 18    Intake/Output Summary (Last 24 hours) at 10/26/15 1433 Last data filed at 10/26/15 1221  Gross per 24 hour  Intake 1163.89 ml  Output  78295 ml  Net -10061.11 ml   Filed Weights   10/25/15 1042 10/25/15 1800 10/26/15 0419  Weight: 285.766 kg (630 lb) 284.5 kg (627 lb 3.3 oz) 266.8 kg (588 lb 3 oz)    ROS: Review of Systems  Unable to perform ROS  patient on the ventilator Exam: Physical Exam  Constitutional: He is intubated.  HENT:  Head: Normocephalic.  Nose: No mucosal edema.  Eyes:  Pupils pinpoint  Neck: Carotid bruit is not present. No thyromegaly present.  Cardiovascular: Regular rhythm, S1 normal and S2 normal.   Pulses:      Dorsalis pedis pulses are 0 on the right side, and 0 on the left side.  Respiratory: No accessory muscle usage. He is intubated. He has decreased breath sounds in the right middle field, the right lower field, the left middle field and the left lower field. He has rales in the right lower field.  GI: Soft. Bowel sounds are normal. There is no tenderness.  Musculoskeletal:       Right ankle: He exhibits swelling.       Left ankle: He exhibits swelling.  Lymphadenopathy:    He has no cervical adenopathy.  Neurological:  Sedated on the ventilator  Skin:  Anteriorly no ulcers  Psychiatric:  Unable to assess since the patient is on the ventilator    Data Reviewed: Basic Metabolic Panel:  Recent Labs Lab 10/25/15 1138 10/26/15 0646 10/26/15 1110  NA 137 144  --   K 3.7 2.6*  --   CL 97* 94*  --   CO2 34* 41*  --   GLUCOSE 102* 85  --   BUN 49* 45*  --   CREATININE 1.59* 1.42*  --   CALCIUM 8.1* 8.3*  --   MG  --    --  1.3*  PHOS  --   --  5.3*   Liver Function Tests:  Recent Labs Lab 10/26/15 0646  AST 85*  ALT 147*  ALKPHOS 56  BILITOT 1.4*  PROT 6.5  ALBUMIN 2.8*   CBC:  Recent Labs Lab 10/25/15 1138 10/26/15 0646  WBC 11.7* 10.8*  NEUTROABS 7.2*  --   HGB 14.7 14.2  HCT 49.5 47.6  MCV 70.5* 70.7*  PLT 187 153   Cardiac Enzymes:  Recent Labs Lab 10/25/15 1138  TROPONINI <0.03   BNP (last 3 results)  Recent Labs  10/25/15 1138  BNP 934.0*     Studies: Dg Chest 1 View  10/26/2015  CLINICAL DATA:  29 year old male status post NG tube placement. EXAM: CHEST 1 VIEW COMPARISON:  Chest x-ray 10/26/2015. FINDINGS: Film is severely underpenetrated secondary to the patient's large body habitus. This limits the diagnostic sensitivity and specificity of the examination. The patient is intubated, with the tip of the endotracheal tube approximately 5 cm above the carina. A nasogastric tube is also visualized which appears to extend toward the abdomen, but the distal aspect of the tube is obscured by overlying soft tissue  opacity. Lung volumes are very low. Extensive bibasilar opacities may reflect areas of atelectasis and/or consolidation throughout the mid to lower lungs bilaterally. Probable moderate right and small left pleural effusion. Distended pulmonary vasculature. Indistinct interstitial markings. Cardiomegaly. Upper mediastinal contours are grossly distorted. IMPRESSION: 1. Support apparatus, as above. 2. Low lung volumes with findings suggestive of congestive heart failure, as above. Electronically Signed   By: Trudie Reedaniel  Entrikin M.D.   On: 10/26/2015 13:24   Dg Chest 1 View  10/25/2015  CLINICAL DATA:  Tube placements EXAM: CHEST 1 VIEW COMPARISON:  10/25/2015 at 11:09 FINDINGS: Endotracheal tube tip is 6.4 cm above the carina. Left jugular central line reaches the expected location of the SVC, probably with its tip just below the azygos vein junction. Orogastric tube is not  conclusively visible. No pneumothorax is evident. Basilar opacities persist bilaterally. Significant study limitations due to patient body habitus and portable technique. IMPRESSION: ET tube and left jugular central line appear satisfactorily positioned. OG tube not visible. Limited study. Electronically Signed   By: Ellery Plunkaniel R Mitchell M.D.   On: 10/25/2015 22:27   Dg Chest 1 View  10/25/2015  CLINICAL DATA:  Pulmonary edema. EXAM: CHEST 1 VIEW COMPARISON:  04/16/2015 FINDINGS: Technically limited study due to morbid obesity and portable technique. Cardiac enlargement with vascular congestion. No definite edema or effusion. Bibasilar atelectasis. IMPRESSION: Limited study. Probable mild fluid overload. Two-view chest x-ray suggested for better quality images. Electronically Signed   By: Marlan Palauharles  Clark M.D.   On: 10/25/2015 11:45   Dg Abd 1 View  10/25/2015  CLINICAL DATA:  Orogastric tube placement EXAM: ABDOMEN - 1 VIEW COMPARISON:  None. FINDINGS: No orogastric tube is identified. Due to poor radiographic technique, the bowel gas cannot be assessed. IMPRESSION: No orogastric tube identified. Electronically Signed   By: Elige KoHetal  Patel   On: 10/25/2015 22:22   Dg Chest Port 1 View  10/26/2015  CLINICAL DATA:  Respiratory failure EXAM: PORTABLE CHEST 1 VIEW COMPARISON:  10/17/2015 FINDINGS: Cardiomegaly again noted. Study is markedly limited by patient's large body habitus and poor inspiration. Persistent central vascular congestion mild interstitial prominence bilateral suspicious for pulmonary edema. Bilateral basilar atelectasis or infiltrate again noted. Stable endotracheal tube position. IMPRESSION: Study is markedly limited by patient's large body habitus and poor inspiration. Persistent central vascular congestion mild interstitial prominence bilateral suspicious for pulmonary edema. Bilateral basilar atelectasis or infiltrate again noted. Stable endotracheal tube position. Electronically Signed   By:  Natasha MeadLiviu  Pop M.D.   On: 10/26/2015 08:04    Scheduled Meds: . allopurinol  100 mg Per Tube Daily  . antiseptic oral rinse  7 mL Mouth Rinse q12n4p  . aspirin  325 mg Per Tube Daily  . budesonide  0.25 mg Nebulization Q6H  . chlorhexidine  15 mL Mouth Rinse BID  . famotidine (PEPCID) IV  20 mg Intravenous Q12H  . feeding supplement (PRO-STAT SUGAR FREE 64)  30 mL Oral 6 times per day  . furosemide  60 mg Intravenous Q12H  . ipratropium-albuterol  3 mL Nebulization Q6H  . magnesium sulfate 1 - 4 g bolus IVPB  4 g Intravenous Once  . potassium chloride 10 mEq in sodium chloride 0.9% 50 mL *CENTRAL LINE* IVPB   Intravenous Q1 Hr x 6  . potassium chloride  40 mEq Oral Once  . sodium chloride  3 mL Intravenous Q12H   Continuous Infusions: . feeding supplement (VITAL HIGH PROTEIN) 1,000 mL (10/26/15 1302)  . heparin 2,400 Units/hr (10/26/15 1132)  .  propofol (DIPRIVAN) infusion 40 mcg/kg/min (10/26/15 1256)    Assessment/Plan:  1. Acute respiratory failure with hypoxia. Patient currently on the ventilator requiring 100% FiO2. Appreciate critical care specialist consultation 2. Acute congestive heart failure. Echocardiogram to determine ejection fraction. Since the patient already put out 10 L. I will decrease Lasix dose to 60 mg every 12 hours. 3. Hypomagnesemia and hypokalemia- patient is on electrolyte protocol in the ICU. Asked the nurse to check 1 hour after the potassium runs her in and replace again if low. Recheck again tomorrow morning. Decreasing dose of Lasix will help out. 4. Morbid obesity, likely sleep apnea. Weight loss needed 5. Patient is empirically on heparin just in case blood clot. As per the sonogram tech so for the legs look negative but wait for the official read by radiologist.  Code Status:     Code Status Orders        Start     Ordered   10/25/15 1805  Full code   Continuous     10/25/15 1804     Family Communication: spoke with the mother on the  phone Disposition Plan: to be determined  Consultants:  Critical care specialist  Procedures:  ET tube  Time spent: 30 minutes critical care time  Alford Highland  [facility]

## 2015-10-26 NOTE — Progress Notes (Signed)
Pharmacy Consult for Electrolyte Management  No Known Allergies  Patient Measurements: Height: 5\' 11"  (180.3 cm) Weight: (!) 588 lb 3 oz (266.8 kg) IBW/kg (Calculated) : 75.3   Vital Signs: Temp: 98.6 F (37 C) (11/29 0700) Temp Source: Oral (11/29 0700) BP: 115/67 mmHg (11/29 1200) Pulse Rate: 87 (11/29 1200) Intake/Output from previous day: 11/28 0701 - 11/29 0700 In: 582.6 [I.V.:582.6] Out: 9025 [Urine:9025] Intake/Output from this shift: Total I/O In: 581.3 [I.V.:531.3; IV Piggyback:50] Out: 2800 [Urine:2800]  Labs:  Recent Labs  10/25/15 1138 10/26/15 0212 10/26/15 0646  WBC 11.7*  --  10.8*  HGB 14.7  --  14.2  HCT 49.5  --  47.6  PLT 187  --  153  APTT  --  23*  --   INR  --  1.30  --      Recent Labs  10/25/15 1138 10/25/15 2205 10/26/15 0646 10/26/15 1110  NA 137  --  144  --   K 3.7  --  2.6*  --   CL 97*  --  94*  --   CO2 34*  --  41*  --   GLUCOSE 102*  --  85  --   BUN 49*  --  45*  --   CREATININE 1.59*  --  1.42*  --   CALCIUM 8.1*  --  8.3*  --   MG  --   --   --  1.3*  PHOS  --   --   --  5.3*  PROT  --   --  6.5  --   ALBUMIN  --   --  2.8*  --   AST  --   --  85*  --   ALT  --   --  147*  --   ALKPHOS  --   --  56  --   BILITOT  --   --  1.4*  --   TRIG  --  257*  --   --    Estimated Creatinine Clearance: 164.9 mL/min (by C-G formula based on Cr of 1.42).    Recent Labs  10/25/15 1721 10/26/15 1209  GLUCAP 87 66    Medical History: Past Medical History  Diagnosis Date  . Morbid obesity (HCC)   . Gout   . History of echocardiogram     a. a. echo 04/15/2015: EF 60-65%, no WMA, LA nl in size, RV poorly visualized, PASP nl, grossly nl study, challening study 2/2 body habitus   . CHF (congestive heart failure) (HCC)     Medications:  Scheduled:  . allopurinol  100 mg Per Tube Daily  . antiseptic oral rinse  7 mL Mouth Rinse q12n4p  . aspirin  325 mg Per Tube Daily  . budesonide  0.25 mg Nebulization Q6H  .  chlorhexidine  15 mL Mouth Rinse BID  . famotidine (PEPCID) IV  20 mg Intravenous Q12H  . feeding supplement (PRO-STAT SUGAR FREE 64)  30 mL Oral 6 times per day  . furosemide  80 mg Intravenous Q6H  . ipratropium-albuterol  3 mL Nebulization Q6H  . magnesium sulfate 1 - 4 g bolus IVPB  4 g Intravenous Once  . potassium chloride 10 mEq in sodium chloride 0.9% 50 mL *CENTRAL LINE* IVPB   Intravenous Q1 Hr x 6  . potassium chloride  40 mEq Oral Once  . sodium chloride  3 mL Intravenous Q12H   Infusions:  . feeding supplement (VITAL HIGH PROTEIN)    .  heparin 2,400 Units/hr (10/26/15 1132)  . propofol (DIPRIVAN) infusion 40 mcg/kg/min (10/26/15 1131)     Assessment: Pharmacy consulted to assist in managing electrolytes in this 28 y/o M with diastolic HF and morbid obesity.   Plan:  Potassium is low at 2.6 and being replaced by MD with 6 runs of 10 meq IV. Will recheck potassium level at 1800. Magnesium is low at 1.3 so will replace with 4 grams magnesium sulfate iv once.   Luisa Hart D 10/26/2015,12:48 PM

## 2015-10-26 NOTE — Progress Notes (Signed)
*  PRELIMINARY RESULTS* Echocardiogram 2D Echocardiogram has been performed.  Georgann HousekeeperJerry R Hege 10/26/2015, 2:00 PM

## 2015-10-27 ENCOUNTER — Inpatient Hospital Stay: Payer: Medicaid Other

## 2015-10-27 DIAGNOSIS — J80 Acute respiratory distress syndrome: Secondary | ICD-10-CM | POA: Insufficient documentation

## 2015-10-27 DIAGNOSIS — Z9911 Dependence on respirator [ventilator] status: Secondary | ICD-10-CM | POA: Insufficient documentation

## 2015-10-27 LAB — BASIC METABOLIC PANEL
Anion gap: 9 (ref 5–15)
Anion gap: 9 (ref 5–15)
BUN: 39 mg/dL — ABNORMAL HIGH (ref 6–20)
BUN: 40 mg/dL — ABNORMAL HIGH (ref 6–20)
CALCIUM: 8.2 mg/dL — AB (ref 8.9–10.3)
CHLORIDE: 93 mmol/L — AB (ref 101–111)
CO2: 43 mmol/L — AB (ref 22–32)
CO2: 43 mmol/L — AB (ref 22–32)
CREATININE: 1.26 mg/dL — AB (ref 0.61–1.24)
CREATININE: 1.26 mg/dL — AB (ref 0.61–1.24)
Calcium: 8.3 mg/dL — ABNORMAL LOW (ref 8.9–10.3)
Chloride: 93 mmol/L — ABNORMAL LOW (ref 101–111)
GFR calc non Af Amer: >60 mL/min (ref >60)
GFR calc non Af Amer: >60 mL/min (ref >60)
GLUCOSE: 131 mg/dL — AB (ref 65–99)
Glucose, Bld: 105 mg/dL — ABNORMAL HIGH (ref 65–99)
Potassium: 3.1 mmol/L — ABNORMAL LOW (ref 3.5–5.1)
Potassium: 3.8 mmol/L (ref 3.5–5.1)
SODIUM: 145 mmol/L (ref 135–145)
Sodium: 145 mmol/L (ref 135–145)

## 2015-10-27 LAB — BLOOD GAS, ARTERIAL
Acid-Base Excess: 19.3 mmol/L — ABNORMAL HIGH (ref 0.0–3.0)
Allens test (pass/fail): POSITIVE — AB
BICARBONATE: 47.7 meq/L — AB (ref 21.0–28.0)
FIO2: 1
LHR: 18 {breaths}/min
MECHANICAL RATE: 18
MECHVT: 600 mL
O2 Saturation: 91.2 %
PEEP: 14 cmH2O
PO2 ART: 58 mmHg — AB (ref 83.0–108.0)
Patient temperature: 37
pCO2 arterial: 67 mmHg — ABNORMAL HIGH (ref 32.0–48.0)
pH, Arterial: 7.46 — ABNORMAL HIGH (ref 7.350–7.450)

## 2015-10-27 LAB — CBC
HCT: 47.2 % (ref 40.0–52.0)
Hemoglobin: 14.2 g/dL (ref 13.0–18.0)
MCH: 21.3 pg — ABNORMAL LOW (ref 26.0–34.0)
MCHC: 30.2 g/dL — AB (ref 32.0–36.0)
MCV: 70.6 fL — ABNORMAL LOW (ref 80.0–100.0)
PLATELETS: 145 10*3/uL — AB (ref 150–440)
RBC: 6.68 MIL/uL — ABNORMAL HIGH (ref 4.40–5.90)
RDW: 20.1 % — AB (ref 11.5–14.5)
WBC: 9 10*3/uL (ref 3.8–10.6)

## 2015-10-27 LAB — HEPARIN LEVEL (UNFRACTIONATED): HEPARIN UNFRACTIONATED: 0.39 [IU]/mL (ref 0.30–0.70)

## 2015-10-27 LAB — RAPID HIV SCREEN (HIV 1/2 AB+AG)
HIV 1/2 Antibodies: NONREACTIVE
HIV-1 P24 ANTIGEN - HIV24: NONREACTIVE

## 2015-10-27 LAB — GLUCOSE, CAPILLARY
GLUCOSE-CAPILLARY: 115 mg/dL — AB (ref 65–99)
Glucose-Capillary: 94 mg/dL (ref 65–99)

## 2015-10-27 LAB — PHOSPHORUS: Phosphorus: 5.3 mg/dL — ABNORMAL HIGH (ref 2.5–4.6)

## 2015-10-27 LAB — MAGNESIUM: Magnesium: 1.7 mg/dL (ref 1.7–2.4)

## 2015-10-27 LAB — PROCALCITONIN: PROCALCITONIN: 0.16 ng/mL

## 2015-10-27 LAB — STREP PNEUMONIAE URINARY ANTIGEN: STREP PNEUMO URINARY ANTIGEN: NEGATIVE

## 2015-10-27 MED ORDER — ANTISEPTIC ORAL RINSE SOLUTION (CORINZ)
7.0000 mL | Freq: Four times a day (QID) | OROMUCOSAL | Status: DC
  Administered 2015-10-27 – 2015-10-30 (×13): 7 mL via OROMUCOSAL
  Filled 2015-10-27 (×15): qty 7

## 2015-10-27 MED ORDER — FUROSEMIDE 10 MG/ML IJ SOLN
50.0000 mg | Freq: Three times a day (TID) | INTRAMUSCULAR | Status: DC
  Administered 2015-10-27 – 2015-10-28 (×2): 50 mg via INTRAVENOUS
  Filled 2015-10-27 (×2): qty 6

## 2015-10-27 MED ORDER — MAGNESIUM OXIDE 400 (241.3 MG) MG PO TABS
400.0000 mg | ORAL_TABLET | Freq: Two times a day (BID) | ORAL | Status: DC
  Administered 2015-10-27 – 2015-10-30 (×7): 400 mg
  Filled 2015-10-27 (×7): qty 1

## 2015-10-27 MED ORDER — SODIUM CHLORIDE 0.9 % IV SOLN
INTRAVENOUS | Status: DC
  Administered 2015-10-27 (×2): via INTRAVENOUS
  Filled 2015-10-27 (×4): qty 50

## 2015-10-27 MED ORDER — POTASSIUM CHLORIDE 20 MEQ/15ML (10%) PO SOLN
20.0000 meq | Freq: Two times a day (BID) | ORAL | Status: DC
  Filled 2015-10-27: qty 15

## 2015-10-27 MED ORDER — SODIUM CHLORIDE 0.9 % IJ SOLN: 10.0000 mL | INTRAMUSCULAR | Status: DC | PRN

## 2015-10-27 MED ORDER — POTASSIUM CHLORIDE 20 MEQ/15ML (10%) PO SOLN
40.0000 meq | Freq: Two times a day (BID) | ORAL | Status: AC
  Administered 2015-10-27 (×2): 40 meq
  Filled 2015-10-27 (×2): qty 30

## 2015-10-27 MED ORDER — DEXTROSE 5 % IV SOLN
2.0000 g | Freq: Three times a day (TID) | INTRAVENOUS | Status: DC
  Administered 2015-10-27 – 2015-10-30 (×11): 2 g via INTRAVENOUS
  Filled 2015-10-27 (×14): qty 2

## 2015-10-27 MED ORDER — METHYLPREDNISOLONE SODIUM SUCC 125 MG IJ SOLR
80.0000 mg | Freq: Three times a day (TID) | INTRAMUSCULAR | Status: DC
  Administered 2015-10-27 – 2015-10-29 (×6): 80 mg via INTRAVENOUS
  Filled 2015-10-27 (×6): qty 2

## 2015-10-27 MED ORDER — CHLORHEXIDINE GLUCONATE 0.12% ORAL RINSE (MEDLINE KIT)
15.0000 mL | Freq: Two times a day (BID) | OROMUCOSAL | Status: DC
  Administered 2015-10-27 – 2015-10-30 (×7): 15 mL via OROMUCOSAL
  Filled 2015-10-27 (×9): qty 15

## 2015-10-27 MED ORDER — VANCOMYCIN HCL 10 G IV SOLR
2000.0000 mg | Freq: Three times a day (TID) | INTRAVENOUS | Status: DC
  Administered 2015-10-27: 2000 mg via INTRAVENOUS
  Filled 2015-10-27 (×3): qty 2000

## 2015-10-27 NOTE — Progress Notes (Signed)
Patient ID: Theodore Reid,ULMER DEGEN [birth year], 29 y.o.   MRN: [id number] [facility] PROGRESS NOTE  PCP: Margaretann Loveless, PA-C  HPI/Subjective: Patient intubated and sedated.   Objective: Filed Vitals:   10/27/15 0900 10/27/15 1200  BP:  115/65  Pulse:  93  Temp: 99.7 F (37.6 C) 100.6 F (38.1 C)  Resp:  18    Intake/Output Summary (Last 24 hours) at 10/27/15 1422 Last data filed at 10/27/15 1400  Gross per 24 hour  Intake 1407.83 ml  Output   9450 ml  Net -8042.17 ml   Filed Weights   10/25/15 1800 10/26/15 0419 10/27/15 0330  Weight: 284.5 kg (627 lb 3.3 oz) 266.8 kg (588 lb 3 oz) 263.1 kg (580 lb 0.5 oz)    ROS: Review of Systems  Unable to perform ROS  patient on the ventilator Exam: Physical Exam  Constitutional: He is intubated.  HENT:  Head: Normocephalic.  Nose: No mucosal edema.  Eyes:  Pupils pinpoint  Neck: Carotid bruit is not present. No thyromegaly present.  Cardiovascular: Regular rhythm, S1 normal and S2 normal.   Pulses:      Dorsalis pedis pulses are 1+ on the right side, and 1+ on the left side.  Respiratory: No accessory muscle usage. He is intubated. He has decreased breath sounds in the right middle field, the right lower field, the left middle field and the left lower field. He has rales in the right lower field and the left lower field.  GI: Soft. Bowel sounds are normal. There is no tenderness.  Musculoskeletal:       Right ankle: He exhibits swelling.       Left ankle: He exhibits swelling.  Lymphadenopathy:    He has no cervical adenopathy.  Neurological:  Sedated on the ventilator  Skin:  Anteriorly no ulcers  Psychiatric:  Unable to assess since the patient is on the ventilator    Data Reviewed: Basic Metabolic Panel:  Recent Labs Lab 10/25/15 1138 10/26/15 0646 10/26/15 1110 10/26/15 2029 10/27/15 0430  NA 137 144  --   --  145  K 3.7 2.6*  --  2.8* 3.1*  CL 97* 94*  --   --  93*  CO2 34*  41*  --   --  43*  GLUCOSE 102* 85  --   --  105*  BUN 49* 45*  --   --  39*  CREATININE 1.59* 1.42*  --   --  1.26*  CALCIUM 8.1* 8.3*  --   --  8.2*  MG  --   --  1.3* 2.0 1.7  PHOS  --   --  5.3*  --  5.3*   Liver Function Tests:  Recent Labs Lab 10/26/15 0646  AST 85*  ALT 147*  ALKPHOS 56  BILITOT 1.4*  PROT 6.5  ALBUMIN 2.8*   CBC:  Recent Labs Lab 10/25/15 1138 10/26/15 0646 10/27/15 0430  WBC 11.7* 10.8* 9.0  NEUTROABS 7.2*  --   --   HGB 14.7 14.2 14.2  HCT 49.5 47.6 47.2  MCV 70.5* 70.7* 70.6*  PLT 187 153 145*   Cardiac Enzymes:  Recent Labs Lab 10/25/15 1138  TROPONINI <0.03   BNP (last 3 results)  Recent Labs  10/25/15 1138  BNP 934.0*     Studies: Dg Chest 1 View  10/27/2015  CLINICAL DATA:  29 year old male with shortness of Breath. Respiratory failure. Initial encounter. EXAM: CHEST 1 VIEW COMPARISON:  10/26/2015 and earlier. FINDINGS: Portable AP semi upright view at 0612 hours. Endotracheal tube tip projects just above the clavicles. Enteric tube cannot be visualized below the level of the carina due to large body habitus. Left IJ central line in place, tip also difficult to visualize. Bilateral veiling pulmonary opacity greater on the right. Obscured diaphragm. Dense retrocardiac opacity. No pneumothorax. Pulmonary vascularity mildly improved since yesterday. IMPRESSION: 1. Endotracheal tube tip just above the clavicles. Enteric tube and left IJ central line tip poorly visible, primarily due to large body habitus. 2. Bilateral pleural effusions with dense bibasilar consolidation or atelectasis. 3. Decreased pulmonary vascular congestion since yesterday. Electronically Signed   By: Odessa FlemingH  Hall M.D.   On: 10/27/2015 07:21   Dg Chest 1 View  10/26/2015  CLINICAL DATA:  29 year old male status post NG tube placement. EXAM: CHEST 1 VIEW COMPARISON:  Chest x-ray 10/26/2015. FINDINGS: Film is severely underpenetrated secondary to the patient's large  body habitus. This limits the diagnostic sensitivity and specificity of the examination. The patient is intubated, with the tip of the endotracheal tube approximately 5 cm above the carina. A nasogastric tube is also visualized which appears to extend toward the abdomen, but the distal aspect of the tube is obscured by overlying soft tissue opacity. Lung volumes are very low. Extensive bibasilar opacities may reflect areas of atelectasis and/or consolidation throughout the mid to lower lungs bilaterally. Probable moderate right and small left pleural effusion. Distended pulmonary vasculature. Indistinct interstitial markings. Cardiomegaly. Upper mediastinal contours are grossly distorted. IMPRESSION: 1. Support apparatus, as above. 2. Low lung volumes with findings suggestive of congestive heart failure, as above. Electronically Signed   By: Trudie Reedaniel  Entrikin M.D.   On: 10/26/2015 13:24   Dg Chest 1 View  10/25/2015  CLINICAL DATA:  Tube placements EXAM: CHEST 1 VIEW COMPARISON:  10/25/2015 at 11:09 FINDINGS: Endotracheal tube tip is 6.4 cm above the carina. Left jugular central line reaches the expected location of the SVC, probably with its tip just below the azygos vein junction. Orogastric tube is not conclusively visible. No pneumothorax is evident. Basilar opacities persist bilaterally. Significant study limitations due to patient body habitus and portable technique. IMPRESSION: ET tube and left jugular central line appear satisfactorily positioned. OG tube not visible. Limited study. Electronically Signed   By: Ellery Plunkaniel R Mitchell M.D.   On: 10/25/2015 22:27   Dg Abd 1 View  10/25/2015  CLINICAL DATA:  Orogastric tube placement EXAM: ABDOMEN - 1 VIEW COMPARISON:  None. FINDINGS: No orogastric tube is identified. Due to poor radiographic technique, the bowel gas cannot be assessed. IMPRESSION: No orogastric tube identified. Electronically Signed   By: Elige KoHetal  Patel   On: 10/25/2015 22:22   [place] Venous Img  Lower Bilateral  10/26/2015  CLINICAL DATA:  29 year old male with a bilateral lower extremity edema EXAM: BILATERAL LOWER EXTREMITY VENOUS DOPPLER ULTRASOUND TECHNIQUE: Gray-scale sonography with graded compression, as well as color Doppler and duplex ultrasound were performed to evaluate the lower extremity deep venous systems from the level of the common femoral vein and including the common femoral, femoral, profunda femoral, popliteal and calf veins including the posterior tibial, peroneal and gastrocnemius veins when visible. The superficial great saphenous vein was also interrogated. Spectral Doppler was utilized to evaluate flow at rest and with distal augmentation maneuvers in the common femoral, femoral and popliteal veins. COMPARISON:  Prior duplex venous ultrasound 04/16/2015 FINDINGS: RIGHT LOWER EXTREMITY Common Femoral Vein: No evidence of thrombus. Normal compressibility, respiratory phasicity and response  to augmentation. Saphenofemoral Junction: No evidence of thrombus. Normal compressibility and flow on color Doppler imaging. Profunda Femoral Vein: No evidence of thrombus. Normal compressibility and flow on color Doppler imaging. Femoral Vein: No evidence of thrombus. Normal compressibility, respiratory phasicity and response to augmentation. Popliteal Vein: No evidence of thrombus. Normal compressibility, respiratory phasicity and response to augmentation. Calf Veins: No evidence of thrombus. Normal compressibility and flow on color Doppler imaging. Superficial Great Saphenous Vein: No evidence of thrombus. Normal compressibility and flow on color Doppler imaging. Venous Reflux:  None. Other Findings:  None. LEFT LOWER EXTREMITY Common Femoral Vein: No evidence of thrombus. Normal compressibility, respiratory phasicity and response to augmentation. Saphenofemoral Junction: No evidence of thrombus. Normal compressibility and flow on color Doppler imaging. Profunda Femoral Vein: No evidence of  thrombus. Normal compressibility and flow on color Doppler imaging. Femoral Vein: No evidence of thrombus. Normal compressibility, respiratory phasicity and response to augmentation. Popliteal Vein: No evidence of thrombus. Normal compressibility, respiratory phasicity and response to augmentation. Calf Veins: No evidence of thrombus. Normal compressibility and flow on color Doppler imaging. Superficial Great Saphenous Vein: No evidence of thrombus. Normal compressibility and flow on color Doppler imaging. Venous Reflux:  None. Other Findings:  None. IMPRESSION: No evidence of deep venous thrombosis. Electronically Signed   By: Malachy Moan M.D.   On: 10/26/2015 15:11   Dg Chest Port 1 View  10/26/2015  CLINICAL DATA:  Respiratory failure EXAM: PORTABLE CHEST 1 VIEW COMPARISON:  10/17/2015 FINDINGS: Cardiomegaly again noted. Study is markedly limited by patient's large body habitus and poor inspiration. Persistent central vascular congestion mild interstitial prominence bilateral suspicious for pulmonary edema. Bilateral basilar atelectasis or infiltrate again noted. Stable endotracheal tube position. IMPRESSION: Study is markedly limited by patient's large body habitus and poor inspiration. Persistent central vascular congestion mild interstitial prominence bilateral suspicious for pulmonary edema. Bilateral basilar atelectasis or infiltrate again noted. Stable endotracheal tube position. Electronically Signed   By: Natasha Mead M.D.   On: 10/26/2015 08:04    Scheduled Meds: . allopurinol  100 mg Per Tube Daily  . antiseptic oral rinse  7 mL Mouth Rinse QID  . aspirin  325 mg Per Tube Daily  . budesonide  0.25 mg Nebulization Q6H  . cefTAZidime (FORTAZ)  IV  2 g Intravenous 3 times per day  . chlorhexidine gluconate  15 mL Mouth Rinse BID  . famotidine (PEPCID) IV  20 mg Intravenous Q12H  . feeding supplement (PRO-STAT SUGAR FREE 64)  30 mL Oral 6 times per day  . furosemide  60 mg Intravenous  Q12H  . ipratropium-albuterol  3 mL Nebulization Q6H  . magnesium oxide  400 mg Per Tube BID  . methylPREDNISolone (SOLU-MEDROL) injection  80 mg Intravenous Q8H  . potassium chloride  40 mEq Per Tube BID  . sodium chloride  3 mL Intravenous Q12H   Continuous Infusions: . feeding supplement (VITAL HIGH PROTEIN) 1,000 mL (10/26/15 1302)  . heparin 2,400 Units/hr (10/27/15 1400)  . propofol (DIPRIVAN) infusion 44.969 mcg/kg/min (10/27/15 1400)    Assessment/Plan:  1. Acute respiratory failure with hypoxia. Patient currently on the ventilator requiring 100% FiO2. Appreciate critical care specialist consultation. Patient put on empiric antibiotics and steroids. 2. Acute diastolic congestive heart failure. The patient with good urine output. Continue Lasix dose to 60 mg every 12 hours. 3. Hypomagnesemia and hypokalemia- patient is on electrolyte protocol in the ICU. Magnesium and potassium supplementation underway. Check electrolytes daily 4. Morbid obesity, likely sleep apnea. Weight  loss needed 5. Patient is empirically on heparin just in case blood clot. Watch platelet count closely as it starting to drop down. 6. Nutrition continue tube feeding  Code Status:     Code Status Orders        Start     Ordered   10/25/15 1805  Full code   Continuous     10/25/15 1804     Family Communication: spoke with the mother on the phone Disposition Plan: to be determined  Consultants:  Critical care specialist  Procedures:  ET tube  Time spent: 32 minutes critical care time  Alford Highland  [facility] Hospitalists

## 2015-10-27 NOTE — Progress Notes (Signed)
   10/27/15 1400  Clinical Encounter Type  Visited With Patient and family together;Other (Comment)  Visit Type Follow-up;Spiritual support  Referral From Other (Comment)  Consult/Referral To Chaplain  Spiritual Encounters  Spiritual Needs Emotional  Stress Factors  Patient Stress Factors Not reviewed  Family Stress Factors None identified  Chaplain was introduced to the patient and family by another staff member. Offered a compassionate presence and support to the family as applicable. Chaplain Kristof Nadeem A. Ivey Cina Ext. [phone]

## 2015-10-27 NOTE — Progress Notes (Signed)
Patient: Theodore Reid / Admit Date: 10/25/2015 / Date of Encounter: 10/27/2015, 7:42 AM   Subjective: No significant events overnight, patient on 100% FiO2 Significant urine output  Review of Systems: ROS Unable to test, patient intubated and sedated  Objective: Telemetry: NSR Physical Exam: Blood pressure 102/64, pulse 97, temperature 97.9 F (36.6 C), temperature source Axillary, resp. rate 18, height  (1.803 m), weight 580 lb 0.5 oz (263.1 kg), SpO2 83 %. Body mass index is 80.93 kg/(m^2). General: Well developed, well nourished, morbidly obese, intubated Head: Normocephalic, atraumatic, sclera non-icteric, no xanthomas, nares are without discharge. Neck: Negative for carotid bruits. JVD not elevated. Lungs: Decreased bilaterally. Heart: RRR with S1 S2. No murmurs, rubs, or gallops appreciated. Abdomen: Morbidly obese, soft Msk: Unable to test Extremities: No clubbing or cyanosis. No edema. 1+ pitting edema to the thighs Neuro: Sedated, intubated Psych: Sedated intubated,   Intake/Output Summary (Last 24 hours) at 10/27/15 0742 Last data filed at 10/27/15 0334  Gross per 24 hour  Intake 1212.51 ml  Output  28413 ml  Net -12437.49 ml    Inpatient Medications:  . allopurinol  100 mg Per Tube Daily  . antiseptic oral rinse  7 mL Mouth Rinse q12n4p  . aspirin  325 mg Per Tube Daily  . budesonide  0.25 mg Nebulization Q6H  . chlorhexidine  15 mL Mouth Rinse BID  . famotidine (PEPCID) IV  20 mg Intravenous Q12H  . feeding supplement (PRO-STAT SUGAR FREE 64)  30 mL Oral 6 times per day  . furosemide  60 mg Intravenous Q12H  . ipratropium-albuterol  3 mL Nebulization Q6H  . potassium chloride  20 mEq Per Tube BID  . potassium chloride  40 mEq Oral Once  . sodium chloride  3 mL Intravenous Q12H   Infusions:  . feeding supplement (VITAL HIGH PROTEIN) 1,000 mL (10/26/15 1302)  . heparin 2,400 Units/hr (10/26/15 2155)  . propofol (DIPRIVAN) infusion  0 mcg/kg/min (10/27/15 0541)    Labs:  Recent Labs  10/26/15 0646  10/26/15 1110 10/26/15 2029 10/27/15 0430  NA 144  --   --   --  145  K 2.6*  --   --  2.8* 3.1*  CL 94*  --   --   --  93*  CO2 41*  --   --   --  43*  GLUCOSE 85  --   --   --  105*  BUN 45*  --   --   --  39*  CREATININE 1.42*  --   --   --  1.26*  CALCIUM 8.3*  --   --   --  8.2*  MG  --   < > 1.3* 2.0 1.7  PHOS  --   --  5.3*  --  5.3*  < > = values in this interval not displayed.  Recent Labs  10/26/15 0646  AST 85*  ALT 147*  ALKPHOS 56  BILITOT 1.4*  PROT 6.5  ALBUMIN 2.8*    Recent Labs  10/25/15 1138 10/26/15 0646 10/27/15 0430  WBC 11.7* 10.8* 9.0  NEUTROABS 7.2*  --   --   HGB 14.7 14.2 14.2  HCT 49.5 47.6 47.2  MCV 70.5* 70.7* 70.6*  PLT 187 153 145*    Recent Labs  10/25/15 1138  TROPONINI <0.03   Invalid input(s): POCBNP No results for input(s): HGBA1C in the last 72 hours.   Weights: Filed Weights   10/25/15 1800 10/26/15 0419  10/27/15 0330  Weight: 627 lb 3.3 oz (284.5 kg) 588 lb 3 oz (266.8 kg) 580 lb 0.5 oz (263.1 kg)     Radiology/Studies:  Dg Chest 1 View  10/27/2015  CLINICAL DATA:  [age] year old male with shortness of Breath. Respiratory failure. Initial encounter. EXAM: CHEST 1 VIEW COMPARISON:  10/26/2015 and earlier. FINDINGS: Portable AP semi upright view at 0612 hours. Endotracheal tube tip projects just above the clavicles. Enteric tube cannot be visualized below the level of the carina due to large body habitus. Left IJ central line in place, tip also difficult to visualize. Bilateral veiling pulmonary opacity greater on the right. Obscured diaphragm. Dense retrocardiac opacity. No pneumothorax. Pulmonary vascularity mildly improved since yesterday. IMPRESSION: 1. Endotracheal tube tip just above the clavicles. Enteric tube and left IJ central line tip poorly visible, primarily due to large body habitus. 2. Bilateral pleural effusions with dense bibasilar  consolidation or atelectasis. 3. Decreased pulmonary vascular congestion since yesterday. Electronically Signed   By: Odessa FlemingH  Hall M.D.   On: 10/27/2015 07:21   Dg Chest 1 View  10/26/2015  CLINICAL DATA:  29 year old male status post NG tube placement. EXAM: CHEST 1 VIEW COMPARISON:  Chest x-ray 10/26/2015. FINDINGS: Film is severely underpenetrated secondary to the patient's large body habitus. This limits the diagnostic sensitivity and specificity of the examination. The patient is intubated, with the tip of the endotracheal tube approximately 5 cm above the carina. A nasogastric tube is also visualized which appears to extend toward the abdomen, but the distal aspect of the tube is obscured by overlying soft tissue opacity. Lung volumes are very low. Extensive bibasilar opacities may reflect areas of atelectasis and/or consolidation throughout the mid to lower lungs bilaterally. Probable moderate right and small left pleural effusion. Distended pulmonary vasculature. Indistinct interstitial markings. Cardiomegaly. Upper mediastinal contours are grossly distorted. IMPRESSION: 1. Support apparatus, as above. 2. Low lung volumes with findings suggestive of congestive heart failure, as above. Electronically Signed   By: Trudie Reedaniel  Entrikin M.D.   On: 10/26/2015 13:24   Dg Chest 1 View  10/25/2015  CLINICAL DATA:  Tube placements EXAM: CHEST 1 VIEW COMPARISON:  10/25/2015 at 11:09 FINDINGS: Endotracheal tube tip is 6.4 cm above the carina. Left jugular central line reaches the expected location of the SVC, probably with its tip just below the azygos vein junction. Orogastric tube is not conclusively visible. No pneumothorax is evident. Basilar opacities persist bilaterally. Significant study limitations due to patient body habitus and portable technique. IMPRESSION: ET tube and left jugular central line appear satisfactorily positioned. OG tube not visible. Limited study. Electronically Signed   By: Ellery Plunkaniel R Mitchell  M.D.   On: 10/25/2015 22:27   Dg Chest 1 View  10/25/2015  CLINICAL DATA:  Pulmonary edema. EXAM: CHEST 1 VIEW COMPARISON:  04/16/2015 FINDINGS: Technically limited study due to morbid obesity and portable technique. Cardiac enlargement with vascular congestion. No definite edema or effusion. Bibasilar atelectasis. IMPRESSION: Limited study. Probable mild fluid overload. Two-view chest x-ray suggested for better quality images. Electronically Signed   By: Marlan Palauharles  Clark M.D.   On: 10/25/2015 11:45   Dg Abd 1 View  10/25/2015  CLINICAL DATA:  Orogastric tube placement EXAM: ABDOMEN - 1 VIEW COMPARISON:  None. FINDINGS: No orogastric tube is identified. Due to poor radiographic technique, the bowel gas cannot be assessed. IMPRESSION: No orogastric tube identified. Electronically Signed   By: Elige KoHetal  Patel   On: 10/25/2015 22:22   [place] Venous Img Lower Bilateral  10/26/2015  IMPRESSION: No evidence of deep venous thrombosis. Electronically Signed   By: Malachy Moan M.D.   On: 10/26/2015 15:11   Dg Chest Port 1 View  10/26/2015 IMPRESSION: Study is markedly limited by patient's large body habitus and poor inspiration. Persistent central vascular congestion mild interstitial prominence bilateral suspicious for pulmonary edema. Bilateral basilar atelectasis or infiltrate again noted. Stable endotracheal tube position. Electronically Signed   By: Natasha Mead M.D.   On: 10/26/2015 08:04     Assessment and Plan  29 y.o. male   1. Acute hypoxic and hypercarbic respiratory failure: -Likely multifactorial in the setting of acute on chronic diastolic CHF and morbid obesity hypoventilation syndrome,  -On 100% FiO2  13 liters negative per notes -Diurese with IV Lasix q 12 hours with KCl repletion to a goal of 4.0 Central line to determine CVP would be helpful in guiding diuresis Still profoundly hypoxic on ABG  2. Acute on chronic diastolic CHF: Would continue aggressive diuresis  3. Morbid  obesity hypoventilation syndrome Will need pulmonary to follow as an outpt, bipap for sleeping/napping Long-term outlook is poor given his inability to lose weight  4. Severe hypokalemia: Pharmacy helping to replete Secondary to diuresis   Signed, Dossie Arbour, MD [facility] 10/27/2015, 7:42 AM

## 2015-10-27 NOTE — Progress Notes (Signed)
[facility] Progess Note    ASSESSMENT/PLAN   This is a 29 year old male with super obesity, congestive heart failure, obesity hypoventilation syndrome. He presents with acute respiratory failure with severe hypoxia, thought to be secondary to acute CHF exacerbation.  PULMONARY A: Acute on chronic hypoxic and hypercarbic respiratory failure Acute pulmonary edema secondary to acute diastolic congestive heart failure. ?ARDS OHS/OSA Smoker Bronchospasm  Refractory hypoxemia.-The patient has been on a PEEP of 14, with an FiO2 of 100%. This a.m. the patient's oxygen saturation dropped to the high to mid 80s. Bagging was initiated, recruitment maneuvers were performed, and oxygen saturation subsequently increased to the mid 90s. The PEEP was increased to a level of 20.  P:  Continue diuresis. The patient has brisk urine output. However, his fluid balance is hampered by needs for excessive IV fluids. We'll try to give this much medication through the patient's NG tube is possible and minimize IV fluid intake. Vent bundle implemented Given the patient's refractory hypoxemia should his oxygenation worsened, we may consider paralytics versus prone ventilation. Scheduled nebulized steroids Scheduled and PRN nebulized bronchodilators Empiric full dose heparin until VTE ruled out Empiric moderate dose steroids.   CARDIOVASCULAR A:  Diastolic HF  P:  Continue diuresis, minimize IV fluids.  RENAL A:  Hypervolemia, likely chronic AKI (baseline Cr 0.84) P:  Kidney injury, slightly improved from admission.  GASTROINTESTINAL A:  Morbid obesity P:  SUP: IV famotidine   HEMATOLOGIC A:  No issues P:  DVT px: full dose heparin  Monitor CBC intermittently Transfuse per usual guidelines  INFECTIOUS A:  No identified infections P:  Monitor temp, WBC count Micro and abx as above PCT protocol ordered 11/28  Sputum culture 10/26/2015;  pending Zosyn 11/30 >> vanco IV 11/30>>  ENDOCRINE A:  Mild hyperglycemia without documented history of DM P:  Monitor glu on chem panels SSI   NEUROLOGIC A:  Acute encephalopathy of critical illness Intermittent agitation with ventilator dyssynchrony P:  RASS goal: -2, -3 Propofol gtt PRN fentanyl PRN vecuronium for vent dyssynch   Chest x-ray 10/27/2015; significantly improved pulmonary edema, morbid obesity. LE dopplers 10/26/15: negative for DVT.  Echo 10/26/15; EF=55%; biventricular dilatation.   ---------------------------------------   ----------------------------------------   Name: Theodore Reid MRN: [id number] DOB: 10-Mar-1986    ADMISSION DATE:  10/25/2015    SUBJECTIVE/review of systems:   Pt currently on the ventilator, can not provide history or review of systems.     VITAL SIGNS: Temp:  [97.9 F (36.6 C)-98.8 F (37.1 C)] 97.9 F (36.6 C) (11/30 0000) Pulse Rate:  [84-98] 97 (11/30 0400) Resp:  [18-22] 18 (11/30 0400) BP: (102-127)/(57-80) 102/64 mmHg (11/30 0400) SpO2:  [83 %-95 %] 83 % (11/30 0400) FiO2 (%):  [90 %-100 %] 100 % (11/30 0336) Weight:  [263.1 kg (580 lb 0.5 oz)] 263.1 kg (580 lb 0.5 oz) (11/30 0330) HEMODYNAMICS:   VENTILATOR SETTINGS: Vent Mode:  [-] PRVC FiO2 (%):  [90 %-100 %] 100 % Set Rate:  [18 bmp] 18 bmp Vt Set:  [600 mL] 600 mL PEEP:  [14 cmH20] 14 cmH20 Plateau Pressure:  [31 cmH20] 31 cmH20 INTAKE / OUTPUT:  Intake/Output Summary (Last 24 hours) at 10/27/15 0759 Last data filed at 10/27/15 0334  Gross per 24 hour  Intake 1212.51 ml  Output  84696 ml  Net -12437.49 ml    PHYSICAL EXAMINATION: Physical Examination:   VS: BP 102/64 mmHg  Pulse 97  Temp(Src) 97.9 F (36.6 C) (Axillary)  Resp 18  Ht  (1.803 m)  Wt 263.1 kg (580 lb 0.5 oz)  BMI 80.93 kg/m2  SpO2 83%  General Appearance: On ventilator Neuro:without focal findings, mental status normal. HEENT: PERRLA, EOM  intact. Pulmonary: normal breath sounds   CardiovascularNormal S1,S2.  No m/r/g.   Abdomen: Benign, Soft, non-tender. Renal:  No costovertebral tenderness  GU:  Not performed at this time. Endocrine: No evident thyromegaly. Skin:   warm, no rashes, no ecchymosis  Extremities: normal, no cyanosis, clubbing.   LABS:   LABORATORY PANEL:   CBC  Recent Labs Lab 10/27/15 0430  WBC 9.0  HGB 14.2  HCT 47.2  PLT 145*    Chemistries   Recent Labs Lab 10/26/15 0646  10/27/15 0430  NA 144  --  145  K 2.6*  < > 3.1*  CL 94*  --  93*  CO2 41*  --  43*  GLUCOSE 85  --  105*  BUN 45*  --  39*  CREATININE 1.42*  --  1.26*  CALCIUM 8.3*  --  8.2*  MG  --   < > 1.7  PHOS  --   < > 5.3*  AST 85*  --   --   ALT 147*  --   --   ALKPHOS 56  --   --   BILITOT 1.4*  --   --   < > = values in this interval not displayed.   Recent Labs Lab 10/26/15 1209 10/26/15 1256 10/26/15 1550 10/26/15 1926 10/27/15 0032 10/27/15 0427  GLUCAP 66 92 95 102* 115* 94    Recent Labs Lab 10/25/15 1859 10/25/15 2100 10/27/15 0500  PHART 7.29* 7.32* 7.46*  PCO2ART 79* 71* 67*  PO2ART 62* 56* 58*    Recent Labs Lab 10/26/15 0646  AST 85*  ALT 147*  ALKPHOS 56  BILITOT 1.4*  ALBUMIN 2.8*    Cardiac Enzymes  Recent Labs Lab 10/25/15 1138  TROPONINI <0.03    RADIOLOGY:  Dg Chest 1 View  10/27/2015  CLINICAL DATA:  29 year old male with shortness of Breath. Respiratory failure. Initial encounter. EXAM: CHEST 1 VIEW COMPARISON:  10/26/2015 and earlier. FINDINGS: Portable AP semi upright view at 0612 hours. Endotracheal tube tip projects just above the clavicles. Enteric tube cannot be visualized below the level of the carina due to large body habitus. Left IJ central line in place, tip also difficult to visualize. Bilateral veiling pulmonary opacity greater on the right. Obscured diaphragm. Dense retrocardiac opacity. No pneumothorax. Pulmonary vascularity mildly improved since  yesterday. IMPRESSION: 1. Endotracheal tube tip just above the clavicles. Enteric tube and left IJ central line tip poorly visible, primarily due to large body habitus. 2. Bilateral pleural effusions with dense bibasilar consolidation or atelectasis. 3. Decreased pulmonary vascular congestion since yesterday. Electronically Signed   By: Odessa Fleming M.D.   On: 10/27/2015 07:21   Dg Chest 1 View  10/26/2015  CLINICAL DATA:  29 year old male status post NG tube placement. EXAM: CHEST 1 VIEW COMPARISON:  Chest x-ray 10/26/2015. FINDINGS: Film is severely underpenetrated secondary to the patient's large body habitus. This limits the diagnostic sensitivity and specificity of the examination. The patient is intubated, with the tip of the endotracheal tube approximately 5 cm above the carina. A nasogastric tube is also visualized which appears to extend toward the abdomen, but the distal aspect of the tube is obscured by overlying soft tissue opacity. Lung volumes are very low. Extensive bibasilar opacities may reflect areas of  atelectasis and/or consolidation throughout the mid to lower lungs bilaterally. Probable moderate right and small left pleural effusion. Distended pulmonary vasculature. Indistinct interstitial markings. Cardiomegaly. Upper mediastinal contours are grossly distorted. IMPRESSION: 1. Support apparatus, as above. 2. Low lung volumes with findings suggestive of congestive heart failure, as above. Electronically Signed   By: Trudie Reedaniel  Entrikin M.D.   On: 10/26/2015 13:24   Dg Chest 1 View  10/25/2015  CLINICAL DATA:  Tube placements EXAM: CHEST 1 VIEW COMPARISON:  10/25/2015 at 11:09 FINDINGS: Endotracheal tube tip is 6.4 cm above the carina. Left jugular central line reaches the expected location of the SVC, probably with its tip just below the azygos vein junction. Orogastric tube is not conclusively visible. No pneumothorax is evident. Basilar opacities persist bilaterally. Significant study  limitations due to patient body habitus and portable technique. IMPRESSION: ET tube and left jugular central line appear satisfactorily positioned. OG tube not visible. Limited study. Electronically Signed   By: Ellery Plunkaniel R Mitchell M.D.   On: 10/25/2015 22:27   Dg Chest 1 View  10/25/2015  CLINICAL DATA:  Pulmonary edema. EXAM: CHEST 1 VIEW COMPARISON:  04/16/2015 FINDINGS: Technically limited study due to morbid obesity and portable technique. Cardiac enlargement with vascular congestion. No definite edema or effusion. Bibasilar atelectasis. IMPRESSION: Limited study. Probable mild fluid overload. Two-view chest x-ray suggested for better quality images. Electronically Signed   By: Marlan Palauharles  Clark M.D.   On: 10/25/2015 11:45   Dg Abd 1 View  10/25/2015  CLINICAL DATA:  Orogastric tube placement EXAM: ABDOMEN - 1 VIEW COMPARISON:  None. FINDINGS: No orogastric tube is identified. Due to poor radiographic technique, the bowel gas cannot be assessed. IMPRESSION: No orogastric tube identified. Electronically Signed   By: Elige KoHetal  Patel   On: 10/25/2015 22:22   [place] Venous Img Lower Bilateral  10/26/2015  CLINICAL DATA:  29 year old male with a bilateral lower extremity edema EXAM: BILATERAL LOWER EXTREMITY VENOUS DOPPLER ULTRASOUND TECHNIQUE: Gray-scale sonography with graded compression, as well as color Doppler and duplex ultrasound were performed to evaluate the lower extremity deep venous systems from the level of the common femoral vein and including the common femoral, femoral, profunda femoral, popliteal and calf veins including the posterior tibial, peroneal and gastrocnemius veins when visible. The superficial great saphenous vein was also interrogated. Spectral Doppler was utilized to evaluate flow at rest and with distal augmentation maneuvers in the common femoral, femoral and popliteal veins. COMPARISON:  Prior duplex venous ultrasound 04/16/2015 FINDINGS: RIGHT LOWER EXTREMITY Common Femoral Vein: No  evidence of thrombus. Normal compressibility, respiratory phasicity and response to augmentation. Saphenofemoral Junction: No evidence of thrombus. Normal compressibility and flow on color Doppler imaging. Profunda Femoral Vein: No evidence of thrombus. Normal compressibility and flow on color Doppler imaging. Femoral Vein: No evidence of thrombus. Normal compressibility, respiratory phasicity and response to augmentation. Popliteal Vein: No evidence of thrombus. Normal compressibility, respiratory phasicity and response to augmentation. Calf Veins: No evidence of thrombus. Normal compressibility and flow on color Doppler imaging. Superficial Great Saphenous Vein: No evidence of thrombus. Normal compressibility and flow on color Doppler imaging. Venous Reflux:  None. Other Findings:  None. LEFT LOWER EXTREMITY Common Femoral Vein: No evidence of thrombus. Normal compressibility, respiratory phasicity and response to augmentation. Saphenofemoral Junction: No evidence of thrombus. Normal compressibility and flow on color Doppler imaging. Profunda Femoral Vein: No evidence of thrombus. Normal compressibility and flow on color Doppler imaging. Femoral Vein: No evidence of thrombus. Normal compressibility, respiratory phasicity and response to  augmentation. Popliteal Vein: No evidence of thrombus. Normal compressibility, respiratory phasicity and response to augmentation. Calf Veins: No evidence of thrombus. Normal compressibility and flow on color Doppler imaging. Superficial Great Saphenous Vein: No evidence of thrombus. Normal compressibility and flow on color Doppler imaging. Venous Reflux:  None. Other Findings:  None. IMPRESSION: No evidence of deep venous thrombosis. Electronically Signed   By: Malachy Moan M.D.   On: 10/26/2015 15:11   Dg Chest Port 1 View  10/26/2015  CLINICAL DATA:  Respiratory failure EXAM: PORTABLE CHEST 1 VIEW COMPARISON:  10/17/2015 FINDINGS: Cardiomegaly again noted. Study is  markedly limited by patient's large body habitus and poor inspiration. Persistent central vascular congestion mild interstitial prominence bilateral suspicious for pulmonary edema. Bilateral basilar atelectasis or infiltrate again noted. Stable endotracheal tube position. IMPRESSION: Study is markedly limited by patient's large body habitus and poor inspiration. Persistent central vascular congestion mild interstitial prominence bilateral suspicious for pulmonary edema. Bilateral basilar atelectasis or infiltrate again noted. Stable endotracheal tube position. Electronically Signed   By: Natasha Mead M.D.   On: 10/26/2015 08:04       --Wells Guiles, MD.  [facility]   Santiago Glad, M.D.  Stephanie Acre, M.D.  Billy Fischer, M.D   Critical Care Attestation.  I have personally obtained a history, examined the patient, evaluated laboratory and imaging results, formulated the assessment and plan and placed orders. The Patient requires high complexity decision making for assessment and support, frequent evaluation and titration of therapies, application of advanced monitoring technologies and extensive interpretation of multiple databases. The patient has critical illness that could lead imminently to failure of 1 or more organ systems and requires the highest level of physician preparedness to intervene.  Critical Care Time devoted to patient care services described in this note is 35 minutes and is exclusive of time spent in procedures.

## 2015-10-27 NOTE — Progress Notes (Signed)
Pharmacy Antibiotic Time-Out Note  Theodore Reid is a 29 y.o. year-old male admitted on 10/25/2015.  The patient is currently on Vancomycin and ceftazidime  for Pneumonia.  Assessment/Plan: Patient is from the community with no risk factors for MRSA. Recommended discontinuation of Vancomycin. MD approved and Vancomycin has been Dcd. Will continue Ceftazidime.   Temp (24hrs), Avg:98.5 F (36.9 C), Min:97.9 F (36.6 C), Max:98.8 F (37.1 C)   Recent Labs Lab 10/25/15 1138 10/26/15 0646 10/27/15 0430  WBC 11.7* 10.8* 9.0    Recent Labs Lab 10/25/15 1138 10/26/15 0646 10/27/15 0430  CREATININE 1.59* 1.42* 1.26*   Estimated Creatinine Clearance: 184 mL/min (by C-G formula based on Cr of 1.26).    Procalcitonin: 0.18  Microbiology Results: 11/29 Sputum: Pending   Thank you for allowing pharmacy to be a part of this patient's care.  Gardner CandleSheema M Elanora Quin PharmD 10/27/2015 11:16 AM

## 2015-10-27 NOTE — Progress Notes (Signed)
Nutrition Follow-up    INTERVENTION:   EN: continue current TF regimen of low rate TF with prostat supplementation to best meet nutritional needs while receiving significant kcals from diprivan  NUTRITION DIAGNOSIS:   Inadequate oral intake related to acute illness as evidenced by NPO status.  GOAL:   Provide needs based on ASPEN/SCCM guidelines  MONITOR:    (Energy Intake, Pulmonary, Digestive System, Electrolyte/Renal Profile, Glucose Profile)  REASON FOR ASSESSMENT:   Ventilator, Consult Enteral/tube feeding initiation and management  ASSESSMENT:    Pt remains on vent  Past Medical History  Diagnosis Date  . Morbid obesity (HCC)   . Gout   . History of echocardiogram     a. a. echo 04/15/2015: EF 60-65%, no WMA, LA nl in size, RV poorly visualized, PASP nl, grossly nl study, challening study 2/2 body habitus   . CHF (congestive heart failure) (HCC)      EN: tolerating Vital High Protein at rate of 15 ml/hr, Prostat 6 times per day  Digestive System: no signs of TF intolerance  Skin:  Reviewed, no issues   Electrolyte and Renal Profile:  Recent Labs Lab 10/25/15 1138 10/26/15 0646 10/26/15 1110 10/26/15 2029 10/27/15 0430  BUN 49* 45*  --   --  39*  CREATININE 1.59* 1.42*  --   --  1.26*  NA 137 144  --   --  145  K 3.7 2.6*  --  2.8* 3.1*  MG  --   --  1.3* 2.0 1.7  PHOS  --   --  5.3*  --  5.3*   Glucose Profile:  Recent Labs  10/26/15 1926 10/27/15 0032 10/27/15 0427  GLUCAP 102* 115* 94    Meds: lasix, potassium chloride, diprivan (current rate of 60 ml/hr provides 1584 kcals in 24 hours)  Height:   Ht Readings from Last 1 Encounters:  10/25/15 5\' 11"  (1.803 m)    Weight:   Wt Readings from Last 1 Encounters:  10/27/15 580 lb 0.5 oz (263.1 kg)     Filed Weights   10/25/15 1800 10/26/15 0419 10/27/15 0330  Weight: 627 lb 3.3 oz (284.5 kg) 588 lb 3 oz (266.8 kg) 580 lb 0.5 oz (263.1 kg)    BMI:  Body mass index is 80.93  kg/(m^2).  Estimated Nutritional Needs:   Kcal:  1716-1950 kcals (22-25 kcals/kg) using IBW 78 kg  Protein:  156-195 g (2.0-2.5 g/kg)   Fluid:  1950-2340 mL (25-30 ml/kg)    HIGH Care Level  Romelle Starcherate Oz Gammel MS, RD, LDN [phone] Pager

## 2015-10-27 NOTE — Progress Notes (Signed)
ANTICOAGULATION CONSULT NOTE - Initial Consult  Pharmacy Consult for heparin drip Indication: VTE treatment  No Known Allergies  Patient Measurements: Height: 5\' 11"  (180.3 cm) Weight: (!) 580 lb 0.5 oz (263.1 kg) IBW/kg (Calculated) : 75.3 Heparin Dosing Weight: 151kg  Vital Signs: Temp: 97.9 F (36.6 C) (11/30 0000) Temp Source: Axillary (11/30 0000) BP: 102/64 mmHg (11/30 0400) Pulse Rate: 97 (11/30 0400)  Labs:  Recent Labs  10/25/15 1138 10/26/15 0212 10/26/15 0646 10/26/15 1110 10/26/15 1933 10/27/15 0430  HGB 14.7  --  14.2  --   --  14.2  HCT 49.5  --  47.6  --   --  47.2  PLT 187  --  153  --   --  145*  APTT  --  23*  --   --   --   --   LABPROT  --  16.3*  --   --   --   --   INR  --  1.30  --   --   --   --   HEPARINUNFRC  --   --   --  0.51 0.49 0.39  CREATININE 1.59*  --  1.42*  --   --  1.26*  TROPONINI <0.03  --   --   --   --   --     Estimated Creatinine Clearance: 184 mL/min (by C-G formula based on Cr of 1.26).   Medical History: Past Medical History  Diagnosis Date  . Morbid obesity (HCC)   . Gout   . History of echocardiogram     a. a. echo 04/15/2015: EF 60-65%, no WMA, LA nl in size, RV poorly visualized, PASP nl, grossly nl study, challening study 2/2 body habitus   . CHF (congestive heart failure) (HCC)     Medications:    Assessment: [name] with morbid obesity and diastolic HF ordered anticoagulation for r/o PE.   Goal of Therapy:  Heparin level 0.3-0.7 units/ml Monitor platelets by anticoagulation protocol: Yes   Plan:  Heparin level is at goal at 0.49 so will continue heparin infusion at 2400 units/hr. Will check a HL and CBC with am labs.   11/30 AM heparin level 0.39. Continue current regimen. Recheck heparin level and CBC in AM.  Fulton ReekMatt Reco Shonk, PharmD, BCPS 10/27/2015 6:34 AM

## 2015-10-27 NOTE — Progress Notes (Addendum)
Pharmacy Consult for Electrolyte Management  No Known Allergies  Patient Measurements: Height:  (180.3 cm) Weight: (!) 580 lb 0.5 oz (263.1 kg) IBW/kg (Calculated) : 75.3   Vital Signs: Temp: 97.9 F (36.6 C) (11/30 0000) Temp Source: Axillary (11/30 0000) BP: 102/64 mmHg (11/30 0400) Pulse Rate: 97 (11/30 0400) Intake/Output from previous day: 11/29 0701 - 11/30 0700 In: 1212.5 [I.V.:1003; NG/GT:59.5; IV Piggyback:150] Out: 96045 [Urine:13650] Intake/Output from this shift: Total I/O In: -  Out: 3750 [Urine:3750]  Labs:  Recent Labs  10/25/15 1138 10/26/15 0212 10/26/15 0646 10/27/15 0430  WBC 11.7*  --  10.8* 9.0  HGB 14.7  --  14.2 14.2  HCT 49.5  --  47.6 47.2  PLT 187  --  153 145*  APTT  --  23*  --   --   INR  --  1.30  --   --      Recent Labs  10/25/15 1138 10/25/15 2205 10/26/15 0646 10/26/15 1110 10/26/15 2029 10/27/15 0430  NA 137  --  144  --   --  145  K 3.7  --  2.6*  --  2.8* 3.1*  CL 97*  --  94*  --   --  93*  CO2 34*  --  41*  --   --  43*  GLUCOSE 102*  --  85  --   --  105*  BUN 49*  --  45*  --   --  39*  CREATININE 1.59*  --  1.42*  --   --  1.26*  CALCIUM 8.1*  --  8.3*  --   --  8.2*  MG  --   --   --  1.3* 2.0 1.7  PHOS  --   --   --  5.3*  --  5.3*  PROT  --   --  6.5  --   --   --   ALBUMIN  --   --  2.8*  --   --   --   AST  --   --  85*  --   --   --   ALT  --   --  147*  --   --   --   ALKPHOS  --   --  56  --   --   --   BILITOT  --   --  1.4*  --   --   --   TRIG  --  257*  --   --   --   --    Estimated Creatinine Clearance: 184 mL/min (by C-G formula based on Cr of 1.26).    Recent Labs  10/26/15 1926 10/27/15 0032 10/27/15 0427  GLUCAP 102* 115* 94    Medical History: Past Medical History  Diagnosis Date  . Morbid obesity (HCC)   . Gout   . History of echocardiogram     a. a. echo 04/15/2015: EF 60-65%, no WMA, LA nl in size, RV poorly visualized, PASP nl, grossly nl study, challening study  2/2 body habitus   . CHF (congestive heart failure) (HCC)     Medications:  Scheduled:  . allopurinol  100 mg Per Tube Daily  . antiseptic oral rinse  7 mL Mouth Rinse q12n4p  . aspirin  325 mg Per Tube Daily  . budesonide  0.25 mg Nebulization Q6H  . chlorhexidine  15 mL Mouth Rinse BID  . famotidine (PEPCID) IV  20 mg Intravenous Q12H  .  feeding supplement (PRO-STAT SUGAR FREE 64)  30 mL Oral 6 times per day  . furosemide  60 mg Intravenous Q12H  . ipratropium-albuterol  3 mL Nebulization Q6H  . potassium chloride  40 mEq Oral Once  . sodium chloride  3 mL Intravenous Q12H   Infusions:  . feeding supplement (VITAL HIGH PROTEIN) 1,000 mL (10/26/15 1302)  . heparin 2,400 Units/hr (10/26/15 2155)  . propofol (DIPRIVAN) infusion 0 mcg/kg/min (10/27/15 0541)     Assessment: Pharmacy consulted to assist in managing electrolytes in this [name] with diastolic HF and morbid obesity.   Plan:  Potassium is low at 2.8 and being replaced by MD with 6 runs of 10 meq IV. Will recheck potassium level with am labs. Magnesium is WNL at 2.0.   11/30 K+ 3.1, PO4 slightly elevated. 20 mEq KCl per tube x 2 doses ordered. Recheck BMP in AM.  Clovia CuffLisa Kluttz, PharmD, BCPS 10/27/2015 6:35 AM

## 2015-10-27 NOTE — Progress Notes (Signed)
Pharmacy Consult for Vancomycin  Indication: pneumonia  No Known Allergies  Patient Measurements: Height: 5\' 11"  (180.3 cm) Weight: (!) 580 lb 0.5 oz (263.1 kg) IBW/kg (Calculated) : 75.3 Adjusted Body Weight: 150 kg  Vital Signs: Temp: 97.9 F (36.6 C) (11/30 0000) Temp Source: Axillary (11/30 0000) BP: 102/64 mmHg (11/30 0400) Pulse Rate: 97 (11/30 0400) Intake/Output from previous day: 11/29 0701 - 11/30 0700 In: 1212.5 [I.V.:1003; NG/GT:59.5; IV Piggyback:150] Out: [id number] [Urine:13650] Intake/Output from this shift:    Labs:  Recent Labs  10/25/15 1138 10/26/15 0646 10/27/15 0430  WBC 11.7* 10.8* 9.0  HGB 14.7 14.2 14.2  PLT 187 153 145*  CREATININE 1.59* 1.42* 1.26*   Estimated Creatinine Clearance: 184 mL/min (by C-G formula based on Cr of 1.26). No results for input(s): VANCOTROUGH, VANCOPEAK, VANCORANDOM, GENTTROUGH, GENTPEAK, GENTRANDOM, TOBRATROUGH, TOBRAPEAK, TOBRARND, AMIKACINPEAK, AMIKACINTROU, AMIKACIN in the last 72 hours.   Microbiology: No results found for this or any previous visit (from the past 720 hour(s)).  Medical History: Past Medical History  Diagnosis Date  . Morbid obesity (HCC)   . Gout   . History of echocardiogram     a. a. echo 04/15/2015: EF 60-65%, no WMA, LA nl in size, RV poorly visualized, PASP nl, grossly nl study, challening study 2/2 body habitus   . CHF (congestive heart failure) (HCC)     Medications:  Scheduled:  . allopurinol  100 mg Per Tube Daily  . antiseptic oral rinse  7 mL Mouth Rinse q12n4p  . aspirin  325 mg Per Tube Daily  . budesonide  0.25 mg Nebulization Q6H  . cefTAZidime (FORTAZ)  IV  2 g Intravenous 3 times per day  . chlorhexidine  15 mL Mouth Rinse BID  . famotidine (PEPCID) IV  20 mg Intravenous Q12H  . feeding supplement (PRO-STAT SUGAR FREE 64)  30 mL Oral 6 times per day  . furosemide  60 mg Intravenous Q12H  . ipratropium-albuterol  3 mL Nebulization Q6H  . potassium chloride 10 mEq in  sodium chloride 0.9% 50 mL *CENTRAL LINE* IVPB   Intravenous Q1 Hr x 4  . potassium chloride  20 mEq Per Tube BID  . potassium chloride  40 mEq Oral Once  . sodium chloride  3 mL Intravenous Q12H  . vancomycin  2,000 mg Intravenous Q8H   Infusions:  . feeding supplement (VITAL HIGH PROTEIN) 1,000 mL (10/26/15 1302)  . heparin 2,400 Units/hr (10/26/15 2155)  . propofol (DIPRIVAN) infusion 0 mcg/kg/min (10/27/15 0541)   Assessment: [name] intubated and sedated with respiratory failure from acute CHF and possible HCAP with orders for empiric ceftazidime and vancomycin.   Goal of Therapy:  Vancomycin trough level 15-20 mcg/ml  Plan:  Will begin vancomycin 2000 mg iv q 8 hours and check a level with the 4th dose. Patient is at high risk of accumulation due to weight of 263 kg. Will need to be vigilant with monitoring.   Theodore HartChristy, Theodore Reid 10/27/2015,8:23 AM

## 2015-10-28 ENCOUNTER — Inpatient Hospital Stay: Payer: Medicaid Other

## 2015-10-28 LAB — BLOOD GAS, ARTERIAL
ALLENS TEST (PASS/FAIL): POSITIVE — AB
Acid-Base Excess: 19.2 mmol/L — ABNORMAL HIGH (ref 0.0–3.0)
BICARBONATE: 48 meq/L — AB (ref 21.0–28.0)
FIO2: 1
MECHANICAL RATE: 18
O2 SAT: 95.5 %
PATIENT TEMPERATURE: 37
PCO2 ART: 74 mmHg — AB (ref 32.0–48.0)
PEEP: 20 cmH2O
PO2 ART: 77 mmHg — AB (ref 83.0–108.0)
VT: 600 mL
pH, Arterial: 7.42 (ref 7.350–7.450)

## 2015-10-28 LAB — BASIC METABOLIC PANEL
ANION GAP: 10 (ref 5–15)
ANION GAP: 9 (ref 5–15)
BUN: 45 mg/dL — ABNORMAL HIGH (ref 6–20)
BUN: 54 mg/dL — ABNORMAL HIGH (ref 6–20)
CHLORIDE: 95 mmol/L — AB (ref 101–111)
CO2: 43 mmol/L — ABNORMAL HIGH (ref 22–32)
CO2: 42 mmol/L — AB (ref 22–32)
Calcium: 8.3 mg/dL — ABNORMAL LOW (ref 8.9–10.3)
Calcium: 8.3 mg/dL — ABNORMAL LOW (ref 8.9–10.3)
Chloride: 94 mmol/L — ABNORMAL LOW (ref 101–111)
Creatinine, Ser: 1.33 mg/dL — ABNORMAL HIGH (ref 0.61–1.24)
Creatinine, Ser: 1.31 mg/dL — ABNORMAL HIGH (ref 0.61–1.24)
GFR calc non Af Amer: >60 mL/min (ref >60)
GLUCOSE: 142 mg/dL — AB (ref 65–99)
Glucose, Bld: 127 mg/dL — ABNORMAL HIGH (ref 65–99)
POTASSIUM: 3.5 mmol/L (ref 3.5–5.1)
POTASSIUM: 3.5 mmol/L (ref 3.5–5.1)
SODIUM: 147 mmol/L — AB (ref 135–145)
Sodium: 146 mmol/L — ABNORMAL HIGH (ref 135–145)

## 2015-10-28 LAB — GLUCOSE, CAPILLARY
GLUCOSE-CAPILLARY: 140 mg/dL — AB (ref 65–99)
GLUCOSE-CAPILLARY: 166 mg/dL — AB (ref 65–99)
GLUCOSE-CAPILLARY: 177 mg/dL — AB (ref 65–99)
Glucose-Capillary: 145 mg/dL — ABNORMAL HIGH (ref 65–99)
Glucose-Capillary: 145 mg/dL — ABNORMAL HIGH (ref 65–99)
Glucose-Capillary: 151 mg/dL — ABNORMAL HIGH (ref 65–99)
Glucose-Capillary: 133 mg/dL — ABNORMAL HIGH (ref 65–99)

## 2015-10-28 LAB — MRSA PCR SCREENING: MRSA by PCR: NEGATIVE

## 2015-10-28 LAB — MAGNESIUM: MAGNESIUM: 1.9 mg/dL (ref 1.7–2.4)

## 2015-10-28 LAB — CBC
HEMATOCRIT: 47.8 % (ref 40.0–52.0)
Hemoglobin: 14 g/dL (ref 13.0–18.0)
MCH: 21.1 pg — AB (ref 26.0–34.0)
MCHC: 29.3 g/dL — ABNORMAL LOW (ref 32.0–36.0)
MCV: 72.2 fL — AB (ref 80.0–100.0)
PLATELETS: 148 10*3/uL — AB (ref 150–440)
RBC: 6.63 MIL/uL — AB (ref 4.40–5.90)
RDW: 20.2 % — ABNORMAL HIGH (ref 11.5–14.5)
WBC: 10.3 10*3/uL (ref 3.8–10.6)

## 2015-10-28 LAB — INFLUENZA PANEL BY PCR (TYPE A & B)
H1N1 flu by pcr: NOT DETECTED
INFLBPCR: NEGATIVE
Influenza A By PCR: NEGATIVE

## 2015-10-28 LAB — TRIGLYCERIDES: TRIGLYCERIDES: 106 mg/dL (ref <150)

## 2015-10-28 LAB — HEPARIN LEVEL (UNFRACTIONATED): Heparin Unfractionated: 0.51 IU/mL (ref 0.30–0.70)

## 2015-10-28 MED ORDER — POTASSIUM CHLORIDE 20 MEQ/15ML (10%) PO SOLN
40.0000 meq | Freq: Once | ORAL | Status: AC
  Administered 2015-10-28: 40 meq
  Filled 2015-10-28: qty 30

## 2015-10-28 MED ORDER — DEXTROSE 5 % IV SOLN
8.0000 mg/h | INTRAVENOUS | Status: DC
  Administered 2015-10-28 – 2015-10-29 (×2): 8 mg/h via INTRAVENOUS
  Filled 2015-10-28 (×2): qty 25

## 2015-10-28 MED ORDER — FREE WATER
100.0000 mL | Status: DC
Start: 2015-10-28 — End: 2015-10-29
  Administered 2015-10-28 – 2015-10-29 (×7): 100 mL

## 2015-10-28 MED ORDER — SENNOSIDES-DOCUSATE SODIUM 8.6-50 MG PO TABS
1.0000 | ORAL_TABLET | Freq: Two times a day (BID) | ORAL | Status: DC
  Administered 2015-10-28 – 2015-10-30 (×5): 1 via ORAL
  Filled 2015-10-28 (×5): qty 1

## 2015-10-28 NOTE — Care Management (Signed)
Patient admitted from home with Chf exacerbation.  Patient on 2 Liters of chronic O2 provided by [facility].  Patient found with sats in the 50's out outside agency.  Patient is active patient at the outpatient heart failure clinic.  Patient does not have active insurance listed.  Patient listed with Medicaid potential.  No family at bedside.  Patient currently intubated and sedated, so history is limited. Reported of 100 lb weight gain in 1-2 weeks.

## 2015-10-28 NOTE — Progress Notes (Signed)
ANTICOAGULATION CONSULT NOTE - Initial Consult  Pharmacy Consult for heparin drip Indication: VTE treatment  No Known Allergies  Patient Measurements: Height: 5\' 11"  (180.3 cm) Weight: (!) 580 lb 0.5 oz (263.1 kg) IBW/kg (Calculated) : 75.3 Heparin Dosing Weight: 151kg  Vital Signs: Temp: 100 F (37.8 C) (12/01 0500) Temp Source: Oral (12/01 0500) BP: 109/51 mmHg (12/01 0500) Pulse Rate: 93 (12/01 0500)  Labs:  Recent Labs  10/25/15 1138 10/26/15 0212 10/26/15 0646  10/26/15 1933 10/27/15 0430 10/27/15 1938 10/28/15 0526  HGB 14.7  --  14.2  --   --  14.2  --   --   HCT 49.5  --  47.6  --   --  47.2  --   --   PLT 187  --  153  --   --  145*  --   --   APTT  --  23*  --   --   --   --   --   --   LABPROT  --  16.3*  --   --   --   --   --   --   INR  --  1.30  --   --   --   --   --   --   HEPARINUNFRC  --   --   --   < > 0.49 0.39  --  0.51  CREATININE 1.59*  --  1.42*  --   --  1.26* 1.26*  --   TROPONINI <0.03  --   --   --   --   --   --   --   < > = values in this interval not displayed.  Estimated Creatinine Clearance: 184 mL/min (by C-G formula based on Cr of 1.26).   Medical History: Past Medical History  Diagnosis Date  . Morbid obesity (HCC)   . Gout   . History of echocardiogram     a. a. echo 04/15/2015: EF 60-65%, no WMA, LA nl in size, RV poorly visualized, PASP nl, grossly nl study, challening study 2/2 body habitus   . CHF (congestive heart failure) (HCC)     Medications:    Assessment: [name] with morbid obesity and diastolic HF ordered anticoagulation for r/o PE.   Goal of Therapy:  Heparin level 0.3-0.7 units/ml Monitor platelets by anticoagulation protocol: Yes   Plan:  Heparin level is at goal at 0.49 so will continue heparin infusion at 2400 units/hr. Will check a HL and CBC with am labs.   11/30 AM heparin level 0.39. Continue current regimen. Recheck heparin level and CBC in AM.  12/01 AM heparin level 0.51. Continue  current regimen. Recheck heparin level and CBC in AM.  Fulton ReekMatt Bostyn Bogie, PharmD, BCPS 10/28/2015 5:54 AM

## 2015-10-28 NOTE — Progress Notes (Signed)
Patient: Theodore Reid / Admit Date: 10/25/2015 / Date of Encounter: 10/28/2015, 8:26 AM   Subjective: No significant events overnight, patient on 100% FiO2 Significant urine output. Weight down to 580 lbs ?! Still intubated and sedated on 100% FiO2   Review of Systems: ROS Unable to test, patient intubated and sedated  Objective: Telemetry: NSR Physical Exam: Blood pressure 122/57, pulse 98, temperature 99.8 F (37.7 C), temperature source Axillary, resp. rate 0, height 5\' 11"  (1.803 m), weight 580 lb 0.5 oz (263.1 kg), SpO2 91 %. Body mass index is 80.93 kg/(m^2). General: Well developed, well nourished, morbidly obese, intubated Head: Normocephalic, atraumatic, sclera non-icteric, no xanthomas, nares are without discharge. Neck: Negative for carotid bruits. JVD not elevated. Lungs: Decreased bilaterally. Heart: RRR with S1 S2. No murmurs, rubs, or gallops appreciated. Abdomen: Morbidly obese, soft Msk: Unable to test Extremities: No clubbing or cyanosis. No edema. 1+ pitting edema to the thighs Neuro: Sedated, intubated Psych: Sedated intubated,   Intake/Output Summary (Last 24 hours) at 10/28/15 0826 Last data filed at 10/28/15 0754  Gross per 24 hour  Intake 1885.09 ml  Output   4256 ml  Net -2370.91 ml    Inpatient Medications:  . allopurinol  100 mg Per Tube Daily  . antiseptic oral rinse  7 mL Mouth Rinse QID  . aspirin  325 mg Per Tube Daily  . budesonide  0.25 mg Nebulization Q6H  . cefTAZidime (FORTAZ)  IV  2 g Intravenous 3 times per day  . chlorhexidine gluconate  15 mL Mouth Rinse BID  . famotidine (PEPCID) IV  20 mg Intravenous Q12H  . feeding supplement (PRO-STAT SUGAR FREE 64)  30 mL Oral 6 times per day  . furosemide  50 mg Intravenous Q8H  . ipratropium-albuterol  3 mL Nebulization Q6H  . magnesium oxide  400 mg Per Tube BID  . methylPREDNISolone (SOLU-MEDROL) injection  80 mg Intravenous Q8H  . sodium chloride  3 mL Intravenous Q12H    Infusions:  . feeding supplement (VITAL HIGH PROTEIN) 1,000 mL (10/26/15 1302)  . heparin 2,400 Units/hr (10/27/15 2012)  . propofol (DIPRIVAN) infusion 50 mcg/kg/min (10/28/15 0530)    Labs:  Recent Labs  10/26/15 1110  10/27/15 0430 10/27/15 1938 10/28/15 0526  NA  --   --  145 145 147*  K  --   < > 3.1* 3.8 3.5  CL  --   --  93* 93* 94*  CO2  --   --  43* 43* 43*  GLUCOSE  --   --  105* 131* 142*  BUN  --   --  39* 40* 45*  CREATININE  --   --  1.26* 1.26* 1.33*  CALCIUM  --   --  8.2* 8.3* 8.3*  MG 1.3*  < > 1.7  --  1.9  PHOS 5.3*  --  5.3*  --   --   < > = values in this interval not displayed.  Recent Labs  10/26/15 0646  AST 85*  ALT 147*  ALKPHOS 56  BILITOT 1.4*  PROT 6.5  ALBUMIN 2.8*    Recent Labs  10/25/15 1138  10/27/15 0430 10/28/15 0526  WBC 11.7*  < > 9.0 10.3  NEUTROABS 7.2*  --   --   --   HGB 14.7  < > 14.2 14.0  HCT 49.5  < > 47.2 47.8  MCV 70.5*  < > 70.6* 72.2*  PLT 187  < > 145* 148*  < > =  values in this interval not displayed.  Recent Labs  10/25/15 1138  TROPONINI <0.03   Invalid input(s): POCBNP No results for input(s): HGBA1C in the last 72 hours.   Weights: Filed Weights   10/26/15 0419 10/27/15 0330 10/28/15 0700  Weight: 588 lb 3 oz (266.8 kg) 580 lb 0.5 oz (263.1 kg) 580 lb 0.5 oz (263.1 kg)     Radiology/Studies:     US Venous Img Lower Bilateral  10/26/2015   IMPRESSION: No evidence of deep venous thrombosis. Electronically Signed   By: Malachy Moan M.D.   On: 10/26/2015 15:11   Dg Chest Port 1 View  10/26/2015 IMPRESSION: Study is markedly limited by patient's large body habitus and poor inspiration. Persistent central vascular congestion mild interstitial prominence bilateral suspicious for pulmonary edema. Bilateral basilar atelectasis or infiltrate again noted. Stable endotracheal tube position. Electronically Signed   By: Natasha Mead M.D.   On: 10/26/2015 08:04     Assessment and Plan  29  y.o. male   1. Acute hypoxic and hypercarbic respiratory failure: -Likely multifactorial in the setting of acute on chronic diastolic CHF and morbid obesity hypoventilation syndrome,  -On 100% FiO2  Weight down to 580 lbs (from 620 ?) not sure if accurate.  - Continue IV diuresis.  - overall prognosis seem very poor.   2. Acute on chronic diastolic CHF: Would continue aggressive diuresis  3. Morbid obesity hypoventilation syndrome Pulmonary following. Will probably require longterm vent/trach.  Consider palliative care consult.      Signed, Lorine Bears, MD  [facility] 10/28/2015, 8:26 AM

## 2015-10-28 NOTE — Progress Notes (Signed)
[facility] Progess Note    ASSESSMENT/PLAN   This is a 29 year old male with super obesity, congestive heart failure, obesity hypoventilation syndrome. He presents with acute respiratory failure with severe hypoxia, thought to be secondary to acute CHF exacerbation.  PULMONARY A: Acute on chronic hypoxic and hypercarbic respiratory failure Acute pulmonary edema secondary to acute diastolic congestive heart failure. ?ARDS-appears unlikely given fairly good lung compliance. OHS/OSA Smoker Bronchospasm  Refractory hypoxemia.-The patient has been on a PEEP of 20, with an FiO2 of 100%. -He's had good urine output. However, given his multiple IV medications. His overall output has been not as expected.  P:  -The patient will be changed to a Lasix drip. Vent bundle implemented Given the patient's refractory hypoxemia should his oxygenation worsened, we may consider paralytics versus prone ventilation. Scheduled nebulized steroids Scheduled and PRN nebulized bronchodilators Empiric full dose heparin until VTE ruled out Empiric moderate dose steroids.   CARDIOVASCULAR A:  Diastolic HF  P:  Continue diuresis, minimize IV fluids.  RENAL A:  Hypervolemia, likely chronic AKI (baseline Cr 0.84) P:  Kidney injury, slightly improved from admission.  GASTROINTESTINAL A:  Morbid obesity P:  SUP: IV famotidine   HEMATOLOGIC A:  No issues P:  DVT px: full dose heparin  Monitor CBC intermittently Transfuse per usual guidelines  INFECTIOUS A:  No identified infections P:  Monitor temp, WBC count Micro and abx as above PCT protocol ordered 11/28  Sputum culture 10/26/2015; pending Ceftaz . 12-1>>  ENDOCRINE A:  Mild hyperglycemia without documented history of DM P:  Monitor glu on chem panels SSI   NEUROLOGIC A:  Acute encephalopathy of critical illness Intermittent agitation with ventilator dyssynchrony P:   RASS goal: -2, -3 Propofol gtt PRN fentanyl PRN vecuronium for vent dyssynch   Chest x-ray 10/27/2015; significantly improved pulmonary edema, morbid obesity. LE dopplers 10/26/15: negative for DVT.  Echo 10/26/15; EF=55%; biventricular dilatation.   ---------------------------------------   ----------------------------------------   Name: Theodore Reid MRN: [id number] DOB: 1985/12/24    ADMISSION DATE:  10/25/2015    SUBJECTIVE/review of systems:   Pt currently on the ventilator, can not provide history or review of systems.     VITAL SIGNS: Temp:  [99.8 F (37.7 C)-101.1 F (38.4 C)] 100.5 F (38.1 C) (12/01 1200) Pulse Rate:  [92-105] 98 (12/01 0800) Resp:  [0-28] 0 (12/01 0800) BP: (107-132)/(51-73) 122/57 mmHg (12/01 0800) SpO2:  [80 %-94 %] 91 % (12/01 0800) FiO2 (%):  [100 %] 100 % (12/01 1200) Weight:  [263.1 kg (580 lb 0.5 oz)] 263.1 kg (580 lb 0.5 oz) (12/01 0700) HEMODYNAMICS:   VENTILATOR SETTINGS: Vent Mode:  [-] PRVC FiO2 (%):  [100 %] 100 % Set Rate:  [18 bmp] 18 bmp Vt Set:  [600 mL] 600 mL PEEP:  [20 cmH20-35 cmH20] 35 cmH20 Plateau Pressure:  [37 cmH20] 37 cmH20 INTAKE / OUTPUT:  Intake/Output Summary (Last 24 hours) at 10/28/15 1336 Last data filed at 10/28/15 1000  Gross per 24 hour  Intake 1324.39 ml  Output   3006 ml  Net -1681.61 ml    PHYSICAL EXAMINATION: Physical Examination:   VS: BP 122/57 mmHg  Pulse 98  Temp(Src) 100.5 F (38.1 C) (Axillary)  Resp 0  Ht 5\' 11"  (1.803 m)  Wt 263.1 kg (580 lb 0.5 oz)  BMI 80.93 kg/m2  SpO2 91%  General Appearance: On ventilator Neuro:without focal findings, mental status normal. HEENT: PERRLA, EOM intact. Pulmonary: normal breath sounds   CardiovascularNormal  S1,S2.  No m/r/g.   Abdomen: Benign, Soft, non-tender. Renal:  No costovertebral tenderness  GU:  Not performed at this time. Endocrine: No evident thyromegaly. Skin:   warm, no rashes, no ecchymosis  Extremities:  normal, no cyanosis, clubbing.   LABS:   LABORATORY PANEL:   CBC  Recent Labs Lab 10/28/15 0526  WBC 10.3  HGB 14.0  HCT 47.8  PLT 148*    Chemistries   Recent Labs Lab 10/26/15 0646  10/27/15 0430  10/28/15 0526  NA 144  --  145  < > 147*  K 2.6*  < > 3.1*  < > 3.5  CL 94*  --  93*  < > 94*  CO2 41*  --  43*  < > 43*  GLUCOSE 85  --  105*  < > 142*  BUN 45*  --  39*  < > 45*  CREATININE 1.42*  --  1.26*  < > 1.33*  CALCIUM 8.3*  --  8.2*  < > 8.3*  MG  --   < > 1.7  --  1.9  PHOS  --   < > 5.3*  --   --   AST 85*  --   --   --   --   ALT 147*  --   --   --   --   ALKPHOS 56  --   --   --   --   BILITOT 1.4*  --   --   --   --   < > = values in this interval not displayed.   Recent Labs Lab 10/27/15 0032 10/27/15 0427 10/27/15 2031 10/28/15 0003 10/28/15 0544 10/28/15 1136  GLUCAP 115* 94 140* 177* 145* 145*    Recent Labs Lab 10/25/15 2100 10/27/15 0500 10/28/15 0414  PHART 7.32* 7.46* 7.42  PCO2ART 71* 67* 74*  PO2ART 56* 58* 77*    Recent Labs Lab 10/26/15 0646  AST 85*  ALT 147*  ALKPHOS 56  BILITOT 1.4*  ALBUMIN 2.8*    Cardiac Enzymes  Recent Labs Lab 10/25/15 1138  TROPONINI <0.03    RADIOLOGY:  Dg Chest 1 View  10/28/2015  CLINICAL DATA:  CHF, respiratory failure, ARDS. EXAM: CHEST 1 VIEW COMPARISON:  Portable chest x-ray of October 27, 2015 FINDINGS: The lungs are adequately inflated. The interstitial markings are increased in the mid and lower lung zones and there is confluent alveolar opacity at both bases greater on the right than on the left. The hemidiaphragms remain partially obscured but are better demonstrated today. The cardiac silhouette remains enlarged. The pulmonary vascularity remains engorged. An esophagogastric tube is visible. The endotracheal tube if present appears to be positioned somewhat superiorly lie at least 2 cm above the superior margin of the clavicular heads and 12 cm above the carina. The  esophagogastric tube cannot be followed beyond the mid thorax. IMPRESSION: Slight interval improvement in bilateral alveolar opacities may reflect resolving pulmonary edema or pneumonia. Stable cardiomegaly and central pulmonary vascular congestion. High positioning of the endotracheal tube 12 cm above the carina. Advancement by 4-5 cm is recommended. Electronically Signed   By: David  [place] M.D.   On: 10/28/2015 07:24   Dg Chest 1 View  10/27/2015  CLINICAL DATA:  29 year old male with shortness of Breath. Respiratory failure. Initial encounter. EXAM: CHEST 1 VIEW COMPARISON:  10/26/2015 and earlier. FINDINGS: Portable AP semi upright view at 0612 hours. Endotracheal tube tip projects just above the clavicles. Enteric tube cannot be  visualized below the level of the carina due to large body habitus. Left IJ central line in place, tip also difficult to visualize. Bilateral veiling pulmonary opacity greater on the right. Obscured diaphragm. Dense retrocardiac opacity. No pneumothorax. Pulmonary vascularity mildly improved since yesterday. IMPRESSION: 1. Endotracheal tube tip just above the clavicles. Enteric tube and left IJ central line tip poorly visible, primarily due to large body habitus. 2. Bilateral pleural effusions with dense bibasilar consolidation or atelectasis. 3. Decreased pulmonary vascular congestion since yesterday. Electronically Signed   By: Odessa Fleming M.D.   On: 10/27/2015 07:21   US Venous Img Lower Bilateral  10/26/2015  CLINICAL DATA:  29 year old male with a bilateral lower extremity edema EXAM: BILATERAL LOWER EXTREMITY VENOUS DOPPLER ULTRASOUND TECHNIQUE: Gray-scale sonography with graded compression, as well as color Doppler and duplex ultrasound were performed to evaluate the lower extremity deep venous systems from the level of the common femoral vein and including the common femoral, femoral, profunda femoral, popliteal and calf veins including the posterior tibial, peroneal and  gastrocnemius veins when visible. The superficial great saphenous vein was also interrogated. Spectral Doppler was utilized to evaluate flow at rest and with distal augmentation maneuvers in the common femoral, femoral and popliteal veins. COMPARISON:  Prior duplex venous ultrasound 04/16/2015 FINDINGS: RIGHT LOWER EXTREMITY Common Femoral Vein: No evidence of thrombus. Normal compressibility, respiratory phasicity and response to augmentation. Saphenofemoral Junction: No evidence of thrombus. Normal compressibility and flow on color Doppler imaging. Profunda Femoral Vein: No evidence of thrombus. Normal compressibility and flow on color Doppler imaging. Femoral Vein: No evidence of thrombus. Normal compressibility, respiratory phasicity and response to augmentation. Popliteal Vein: No evidence of thrombus. Normal compressibility, respiratory phasicity and response to augmentation. Calf Veins: No evidence of thrombus. Normal compressibility and flow on color Doppler imaging. Superficial Great Saphenous Vein: No evidence of thrombus. Normal compressibility and flow on color Doppler imaging. Venous Reflux:  None. Other Findings:  None. LEFT LOWER EXTREMITY Common Femoral Vein: No evidence of thrombus. Normal compressibility, respiratory phasicity and response to augmentation. Saphenofemoral Junction: No evidence of thrombus. Normal compressibility and flow on color Doppler imaging. Profunda Femoral Vein: No evidence of thrombus. Normal compressibility and flow on color Doppler imaging. Femoral Vein: No evidence of thrombus. Normal compressibility, respiratory phasicity and response to augmentation. Popliteal Vein: No evidence of thrombus. Normal compressibility, respiratory phasicity and response to augmentation. Calf Veins: No evidence of thrombus. Normal compressibility and flow on color Doppler imaging. Superficial Great Saphenous Vein: No evidence of thrombus. Normal compressibility and flow on color Doppler  imaging. Venous Reflux:  None. Other Findings:  None. IMPRESSION: No evidence of deep venous thrombosis. Electronically Signed   By: Malachy Moan M.D.   On: 10/26/2015 15:11       --Wells Guiles, MD.  [facility]   Santiago Glad, M.D.  Stephanie Acre, M.D.  Billy Fischer, M.D   Critical Care Attestation.  I have personally obtained a history, examined the patient, evaluated laboratory and imaging results, formulated the assessment and plan and placed orders. The Patient requires high complexity decision making for assessment and support, frequent evaluation and titration of therapies, application of advanced monitoring technologies and extensive interpretation of multiple databases. The patient has critical illness that could lead imminently to failure of 1 or more organ systems and requires the highest level of physician preparedness to intervene.  Critical Care Time devoted to patient care services described in this note is 35 minutes and is exclusive of  time spent in procedures.

## 2015-10-28 NOTE — Progress Notes (Signed)
Nutrition Follow-up     INTERVENTION:   EN: discussed inability of tube placement to be confirmed via xrays and episode of vomiting with MD Nicholos Johnsamachandran; plan is to continue tube feeding at present. If continues with episodes of vomiting, recommend holding TF until unable to confirm placement of NG tube, assess for other GI abnormalities. Do not recommend increasing rate at this time with current diprivan, continue Prostat supplementation. Discussed hypernatremia during ICU rounds, received order from MD Ramachandran to change free water flush to 100 mL q 4 hours  NUTRITION DIAGNOSIS:   Inadequate oral intake related to acute illness as evidenced by NPO status.  GOAL:   Provide needs based on ASPEN/SCCM guidelines  MONITOR:    (Energy Intake, Pulmonary, Digestive System, Electrolyte/Renal Profile, Glucose Profile)  REASON FOR ASSESSMENT:   Ventilator, Consult Enteral/tube feeding initiation and management  ASSESSMENT:   Pt remains on vent  EN: Vital High Protein infusing at 15 ml/hr, Prostat q 4 hours, 30 mL q 4 hour free water maintenance flush  Digestive System: per report, pt vomited TF yesterday and TF placed on hold. TF started restarted at some point during the night. Placement of tube unable to be confirmed via xray  Skin:  Reviewed, no issues  Last BM:  11/28   Electrolyte and Renal Profile:  Recent Labs Lab 10/26/15 1110 10/26/15 2029 10/27/15 0430 10/27/15 1938 10/28/15 0526  BUN  --   --  39* 40* 45*  CREATININE  --   --  1.26* 1.26* 1.33*  NA  --   --  145 145 147*  K  --  2.8* 3.1* 3.8 3.5  MG 1.3* 2.0 1.7  --  1.9  PHOS 5.3*  --  5.3*  --   --    Glucose Profile:  Recent Labs  10/28/15 0003 10/28/15 0544 10/28/15 1136  GLUCAP 177* 145* 145*   Nutritional Anemia Profile:  CBC Latest Ref Rng 10/28/2015 10/27/2015 10/26/2015  WBC 3.8 - 10.6 K/uL 10.3 9.0 10.8(H)  Hemoglobin 13.0 - 18.0 g/dL 16.114.0 09.614.2 04.514.2  Hematocrit 40.0 - 52.0 % 47.8 47.2  47.6  Platelets 150 - 440 K/uL 148(L) 145(L) 153    No results found for: HGBA1C   Meds: lasix drip, solumedrol, diprivan (1053 kcals in 24 hours based on recorded intake, at current rate of 47.7 ml/hr-1259 kcals in 24 hours)  Height:   Ht Readings from Last 1 Encounters:  10/25/15 5\' 11"  (1.803 m)    Weight:   Wt Readings from Last 1 Encounters:  10/28/15 580 lb 0.5 oz (263.1 kg)    BMI:  Body mass index is 80.93 kg/(m^2).  Estimated Nutritional Needs:   Kcal:  1716-1950 kcals (22-25 kcals/kg) using IBW 78 kg  Protein:  156-195 g (2.0-2.5 g/kg)   Fluid:  1950-2340 mL (25-30 ml/kg)   HIGH Care Level  Romelle Starcherate Galileo Colello MS, RD, LDN [phone] Pager

## 2015-10-28 NOTE — Progress Notes (Signed)
   10/28/15 1215  Clinical Encounter Type  Visited With Family  Visit Type Follow-up  Consult/Referral To Chaplain  Spiritual Encounters  Spiritual Needs Emotional  Stress Factors  Family Stress Factors None identified  Chaplain rounded in the unit and saw family in waiting room. Asked for an update and offered a compassionate presence and support as applicable. Chaplain Greysen Swanton A. Evonne Rinks Ext. [phone]

## 2015-10-28 NOTE — Progress Notes (Signed)
eLink Physician-Brief Progress Note Patient Name: Theodore NewtonMichael J Fleisher DOB: [birth year] MRN: [id number]   Date of Service  10/28/2015  HPI/Events of Note    eICU Interventions  Hypokalemia -repleted      Intervention Category Minor Interventions: Electrolytes abnormality - evaluation and management  Ascencion Coye V. 10/28/2015, 11:37 PM

## 2015-10-28 NOTE — Progress Notes (Signed)
Patient ID: Theodore Reid, male   DOB: December 20, 1985, 29 y.o.   MRN: [id number] [facility] PROGRESS NOTE  PCP: Margaretann Loveless, PA-C  HPI/Subjective: Patient intubated and sedated.   Objective: Filed Vitals:   10/28/15 1300 10/28/15 1400  BP: 111/53 111/56  Pulse: 100 96  Temp:    Resp: 18 18    Intake/Output Summary (Last 24 hours) at 10/28/15 1455 Last data filed at 10/28/15 1400  Gross per 24 hour  Intake 1867.09 ml  Output   3581 ml  Net -1713.91 ml   Filed Weights   10/26/15 0419 10/27/15 0330 10/28/15 0700  Weight: 266.8 kg (588 lb 3 oz) 263.1 kg (580 lb 0.5 oz) 263.1 kg (580 lb 0.5 oz)    ROS: Review of Systems  Unable to perform ROS  patient on the ventilator Exam: Physical Exam  Constitutional: He is intubated.  HENT:  Head: Normocephalic.  Nose: No mucosal edema.  Eyes:  Pupils pinpoint  Neck: Carotid bruit is not present. No thyromegaly present.  Cardiovascular: Regular rhythm, S1 normal and S2 normal.   Pulses:      Dorsalis pedis pulses are 1+ on the right side, and 1+ on the left side.  Respiratory: No accessory muscle usage. He is intubated. He has decreased breath sounds in the right middle field, the right lower field, the left middle field and the left lower field. He has rales in the right lower field and the left lower field.  GI: Soft. Bowel sounds are normal. There is no tenderness.  Musculoskeletal:       Right ankle: He exhibits swelling.       Left ankle: He exhibits swelling.  Lymphadenopathy:    He has no cervical adenopathy.  Neurological:  Sedated on the ventilator  Skin:  Anteriorly no ulcers  Psychiatric:  Unable to assess since the patient is on the ventilator    Data Reviewed: Basic Metabolic Panel:  Recent Labs Lab 10/25/15 1138 10/26/15 0646 10/26/15 1110 10/26/15 2029 10/27/15 0430 10/27/15 1938 10/28/15 0526  NA 137 144  --   --  145 145 147*  K 3.7 2.6*  --  2.8* 3.1* 3.8 3.5  CL 97* 94*   --   --  93* 93* 94*  CO2 34* 41*  --   --  43* 43* 43*  GLUCOSE 102* 85  --   --  105* 131* 142*  BUN 49* 45*  --   --  39* 40* 45*  CREATININE 1.59* 1.42*  --   --  1.26* 1.26* 1.33*  CALCIUM 8.1* 8.3*  --   --  8.2* 8.3* 8.3*  MG  --   --  1.3* 2.0 1.7  --  1.9  PHOS  --   --  5.3*  --  5.3*  --   --    Liver Function Tests:  Recent Labs Lab 10/26/15 0646  AST 85*  ALT 147*  ALKPHOS 56  BILITOT 1.4*  PROT 6.5  ALBUMIN 2.8*   CBC:  Recent Labs Lab 10/25/15 1138 10/26/15 0646 10/27/15 0430 10/28/15 0526  WBC 11.7* 10.8* 9.0 10.3  NEUTROABS 7.2*  --   --   --   HGB 14.7 14.2 14.2 14.0  HCT 49.5 47.6 47.2 47.8  MCV 70.5* 70.7* 70.6* 72.2*  PLT 187 153 145* 148*   Cardiac Enzymes:  Recent Labs Lab 10/25/15 1138  TROPONINI <0.03   BNP (last 3 results)  Recent Labs  10/25/15 1138  BNP 934.0*     Studies: Dg Chest 1 View  10/28/2015  CLINICAL DATA:  CHF, respiratory failure, ARDS. EXAM: CHEST 1 VIEW COMPARISON:  Portable chest x-ray of October 27, 2015 FINDINGS: The lungs are adequately inflated. The interstitial markings are increased in the mid and lower lung zones and there is confluent alveolar opacity at both bases greater on the right than on the left. The hemidiaphragms remain partially obscured but are better demonstrated today. The cardiac silhouette remains enlarged. The pulmonary vascularity remains engorged. An esophagogastric tube is visible. The endotracheal tube if present appears to be positioned somewhat superiorly lie at least 2 cm above the superior margin of the clavicular heads and 12 cm above the carina. The esophagogastric tube cannot be followed beyond the mid thorax. IMPRESSION: Slight interval improvement in bilateral alveolar opacities may reflect resolving pulmonary edema or pneumonia. Stable cardiomegaly and central pulmonary vascular congestion. High positioning of the endotracheal tube 12 cm above the carina. Advancement by 4-5 cm is  recommended. Electronically Signed   By: David  [place]Jordan M.D.   On: 10/28/2015 07:24   Dg Chest 1 View  10/27/2015  CLINICAL DATA:  29 year old male with shortness of Breath. Respiratory failure. Initial encounter. EXAM: CHEST 1 VIEW COMPARISON:  10/26/2015 and earlier. FINDINGS: Portable AP semi upright view at 0612 hours. Endotracheal tube tip projects just above the clavicles. Enteric tube cannot be visualized below the level of the carina due to large body habitus. Left IJ central line in place, tip also difficult to visualize. Bilateral veiling pulmonary opacity greater on the right. Obscured diaphragm. Dense retrocardiac opacity. No pneumothorax. Pulmonary vascularity mildly improved since yesterday. IMPRESSION: 1. Endotracheal tube tip just above the clavicles. Enteric tube and left IJ central line tip poorly visible, primarily due to large body habitus. 2. Bilateral pleural effusions with dense bibasilar consolidation or atelectasis. 3. Decreased pulmonary vascular congestion since yesterday. Electronically Signed   By: Odessa FlemingH  Hall M.D.   On: 10/27/2015 07:21   [place] Venous Img Lower Bilateral  10/26/2015  CLINICAL DATA:  29 year old male with a bilateral lower extremity edema EXAM: BILATERAL LOWER EXTREMITY VENOUS DOPPLER ULTRASOUND TECHNIQUE: Gray-scale sonography with graded compression, as well as color Doppler and duplex ultrasound were performed to evaluate the lower extremity deep venous systems from the level of the common femoral vein and including the common femoral, femoral, profunda femoral, popliteal and calf veins including the posterior tibial, peroneal and gastrocnemius veins when visible. The superficial great saphenous vein was also interrogated. Spectral Doppler was utilized to evaluate flow at rest and with distal augmentation maneuvers in the common femoral, femoral and popliteal veins. COMPARISON:  Prior duplex venous ultrasound 04/16/2015 FINDINGS: RIGHT LOWER EXTREMITY Common Femoral  Vein: No evidence of thrombus. Normal compressibility, respiratory phasicity and response to augmentation. Saphenofemoral Junction: No evidence of thrombus. Normal compressibility and flow on color Doppler imaging. Profunda Femoral Vein: No evidence of thrombus. Normal compressibility and flow on color Doppler imaging. Femoral Vein: No evidence of thrombus. Normal compressibility, respiratory phasicity and response to augmentation. Popliteal Vein: No evidence of thrombus. Normal compressibility, respiratory phasicity and response to augmentation. Calf Veins: No evidence of thrombus. Normal compressibility and flow on color Doppler imaging. Superficial Great Saphenous Vein: No evidence of thrombus. Normal compressibility and flow on color Doppler imaging. Venous Reflux:  None. Other Findings:  None. LEFT LOWER EXTREMITY Common Femoral Vein: No evidence of thrombus. Normal compressibility, respiratory phasicity and response to augmentation. Saphenofemoral Junction: No evidence of thrombus. Normal compressibility  and flow on color Doppler imaging. Profunda Femoral Vein: No evidence of thrombus. Normal compressibility and flow on color Doppler imaging. Femoral Vein: No evidence of thrombus. Normal compressibility, respiratory phasicity and response to augmentation. Popliteal Vein: No evidence of thrombus. Normal compressibility, respiratory phasicity and response to augmentation. Calf Veins: No evidence of thrombus. Normal compressibility and flow on color Doppler imaging. Superficial Great Saphenous Vein: No evidence of thrombus. Normal compressibility and flow on color Doppler imaging. Venous Reflux:  None. Other Findings:  None. IMPRESSION: No evidence of deep venous thrombosis. Electronically Signed   By: Malachy Moan M.D.   On: 10/26/2015 15:11    Scheduled Meds: . allopurinol  100 mg Per Tube Daily  . antiseptic oral rinse  7 mL Mouth Rinse QID  . aspirin  325 mg Per Tube Daily  . budesonide  0.25 mg  Nebulization Q6H  . cefTAZidime (FORTAZ)  IV  2 g Intravenous 3 times per day  . chlorhexidine gluconate  15 mL Mouth Rinse BID  . famotidine (PEPCID) IV  20 mg Intravenous Q12H  . feeding supplement (PRO-STAT SUGAR FREE 64)  30 mL Oral 6 times per day  . free water  100 mL Per Tube 6 times per day  . ipratropium-albuterol  3 mL Nebulization Q6H  . magnesium oxide  400 mg Per Tube BID  . methylPREDNISolone (SOLU-MEDROL) injection  80 mg Intravenous Q8H  . senna-docusate  1 tablet Oral BID  . sodium chloride  3 mL Intravenous Q12H   Continuous Infusions: . feeding supplement (VITAL HIGH PROTEIN) 1,000 mL (10/26/15 1302)  . furosemide (LASIX) infusion 8 mg/hr (10/28/15 1400)  . heparin 2,400 Units/hr (10/28/15 1400)  . propofol (DIPRIVAN) infusion 50 mcg/kg/min (10/28/15 1400)    Assessment/Plan:  1. Acute respiratory failure with hypoxia. Patient currently on the ventilator requiring 100% FiO2. Appreciate critical care specialist consultation. Patient put on empiric antibiotics and steroids. Any little movement to the patient he desaturates quickly. Overall prognosis is poor. 2. Acute diastolic congestive heart failure. Patient is now on IV Lasix drip. The patient is getting a lot of IV fluids with the heparin drip and sedation. 3. Hypomagnesemia and hypokalemia- patient is on electrolyte protocol in the ICU. Magnesium and potassium supplementation underway. Check electrolytes daily 4. Morbid obesity, likely sleep apnea. Weight loss needed 5. Patient is empirically on heparin just in case blood clot. Watch platelet count closely as it starting to drop down. 6. Nutrition continue tube feeding  Code Status:     Code Status Orders        Start     Ordered   10/25/15 1805  Full code   Continuous     10/25/15 1804     Family Communication: spoke with the mother at the bedside Disposition Plan: to be determined  Consultants:  Critical care specialist  Procedures:  ET  tube  Time spent: 32 minutes critical care time  Alford Highland  [facility]

## 2015-10-28 NOTE — Progress Notes (Signed)
Pt lying in bed sedated with propofol @ 50mcgheparin @24ml . Pt sats89-92% on 100%fi02 . Pt tF started back as ordered at 5am . Pt had residual 10-5830ml. Pt. Will arouse when sedation is decreased and will pull up off bed and move hands towards tube pt did not follow commands. Pt had periods of agitation and was given fentanyl pushes which were effective.

## 2015-10-28 NOTE — Progress Notes (Signed)
Pharmacy Consult for Electrolyte Management  No Known Allergies  Patient Measurements: Height: 5\' 11"  (180.3 cm) Weight: (!) 580 lb 0.5 oz (263.1 kg) IBW/kg (Calculated) : 75.3   Vital Signs: Temp: 100 F (37.8 C) (12/01 0500) Temp Source: Oral (12/01 0500) BP: 109/51 mmHg (12/01 0500) Pulse Rate: 93 (12/01 0500) Intake/Output from previous day: 11/30 0701 - 12/01 0700 In: 1924.1 [I.V.:1289.1; NG/GT:335; IV Piggyback:300] Out: 3970 [Urine:3970] Intake/Output from this shift: Total I/O In: 678.3 [I.V.:488.3; NG/GT:90; IV Piggyback:100] Out: 1500 [Urine:1500]  Labs:  Recent Labs  10/25/15 1138 10/26/15 0212 10/26/15 0646 10/27/15 0430  WBC 11.7*  --  10.8* 9.0  HGB 14.7  --  14.2 14.2  HCT 49.5  --  47.6 47.2  PLT 187  --  153 145*  APTT  --  23*  --   --   INR  --  1.30  --   --      Recent Labs  10/25/15 2205 10/26/15 0646  10/26/15 1110 10/26/15 2029 10/27/15 0430 10/27/15 1938 10/28/15 0526  NA  --  144  --   --   --  145 145 147*  K  --  2.6*  --   --  2.8* 3.1* 3.8 3.5  CL  --  94*  --   --   --  93* 93* 94*  CO2  --  41*  --   --   --  43* 43* 43*  GLUCOSE  --  85  --   --   --  105* 131* 142*  BUN  --  45*  --   --   --  39* 40* 45*  CREATININE  --  1.42*  --   --   --  1.26* 1.26* 1.33*  CALCIUM  --  8.3*  --   --   --  8.2* 8.3* 8.3*  MG  --   --   < > 1.3* 2.0 1.7  --  1.9  PHOS  --   --   --  5.3*  --  5.3*  --   --   PROT  --  6.5  --   --   --   --   --   --   ALBUMIN  --  2.8*  --   --   --   --   --   --   AST  --  85*  --   --   --   --   --   --   ALT  --  147*  --   --   --   --   --   --   ALKPHOS  --  56  --   --   --   --   --   --   BILITOT  --  1.4*  --   --   --   --   --   --   TRIG 257*  --   --   --   --   --   --   --   < > = values in this interval not displayed. Estimated Creatinine Clearance: 174.3 mL/min (by C-G formula based on Cr of 1.33).    Recent Labs  10/27/15 2031 10/28/15 0003 10/28/15 0544  GLUCAP  140* 177* 145*    Medical History: Past Medical History  Diagnosis Date  . Morbid obesity (HCC)   . Gout   . History of echocardiogram  a. a. echo 04/15/2015: EF 60-65%, no WMA, LA nl in size, RV poorly visualized, PASP nl, grossly nl study, challening study 2/2 body habitus   . CHF (congestive heart failure) (HCC)     Medications:  Scheduled:  . allopurinol  100 mg Per Tube Daily  . antiseptic oral rinse  7 mL Mouth Rinse QID  . aspirin  325 mg Per Tube Daily  . budesonide  0.25 mg Nebulization Q6H  . cefTAZidime (FORTAZ)  IV  2 g Intravenous 3 times per day  . chlorhexidine gluconate  15 mL Mouth Rinse BID  . famotidine (PEPCID) IV  20 mg Intravenous Q12H  . feeding supplement (PRO-STAT SUGAR FREE 64)  30 mL Oral 6 times per day  . furosemide  50 mg Intravenous Q8H  . ipratropium-albuterol  3 mL Nebulization Q6H  . magnesium oxide  400 mg Per Tube BID  . methylPREDNISolone (SOLU-MEDROL) injection  80 mg Intravenous Q8H  . sodium chloride  3 mL Intravenous Q12H   Infusions:  . feeding supplement (VITAL HIGH PROTEIN) 1,000 mL (10/26/15 1302)  . heparin 2,400 Units/hr (10/27/15 2012)  . propofol (DIPRIVAN) infusion 50 mcg/kg/min (10/28/15 0530)     Assessment: Pharmacy consulted to assist in managing electrolytes in this [name] with diastolic HF and morbid obesity.   Plan:  Potassium is low at 2.8 and being replaced by MD with 6 runs of 10 meq IV. Will recheck potassium level with am labs. Magnesium is WNL at 2.0.   11/30 K+ 3.1, PO4 slightly elevated. 20 mEq KCl per tube x 2 doses ordered. Recheck BMP in AM.  12/1 AM electrolyte panel, no replacement indicated. F/u BMP tomorrow AM.  Fulton Reek, PharmD, BCPS 10/28/2015 5:55 AM

## 2015-10-29 ENCOUNTER — Inpatient Hospital Stay: Payer: Medicaid Other

## 2015-10-29 DIAGNOSIS — J80 Acute respiratory distress syndrome: Secondary | ICD-10-CM

## 2015-10-29 LAB — BASIC METABOLIC PANEL
ANION GAP: 5 (ref 5–15)
Anion gap: 7 (ref 5–15)
BUN: 58 mg/dL — AB (ref 6–20)
BUN: 70 mg/dL — ABNORMAL HIGH (ref 6–20)
CALCIUM: 8.3 mg/dL — AB (ref 8.9–10.3)
CALCIUM: 8.5 mg/dL — AB (ref 8.9–10.3)
CO2: 46 mmol/L — ABNORMAL HIGH (ref 22–32)
CO2: 45 mmol/L — ABNORMAL HIGH (ref 22–32)
CREATININE: 1.47 mg/dL — AB (ref 0.61–1.24)
Chloride: 96 mmol/L — ABNORMAL LOW (ref 101–111)
Chloride: 97 mmol/L — ABNORMAL LOW (ref 101–111)
Creatinine, Ser: 1.32 mg/dL — ABNORMAL HIGH (ref 0.61–1.24)
GFR calc Af Amer: >60 mL/min (ref >60)
GFR calc non Af Amer: >60 mL/min (ref >60)
GLUCOSE: 122 mg/dL — AB (ref 65–99)
Glucose, Bld: 121 mg/dL — ABNORMAL HIGH (ref 65–99)
Potassium: 3.8 mmol/L (ref 3.5–5.1)
Potassium: 4 mmol/L (ref 3.5–5.1)
SODIUM: 147 mmol/L — AB (ref 135–145)
SODIUM: 149 mmol/L — AB (ref 135–145)

## 2015-10-29 LAB — CBC
HEMATOCRIT: 48.1 % (ref 40.0–52.0)
HEMOGLOBIN: 14.1 g/dL (ref 13.0–18.0)
MCH: 21.2 pg — AB (ref 26.0–34.0)
MCHC: 29.3 g/dL — AB (ref 32.0–36.0)
MCV: 72.2 fL — AB (ref 80.0–100.0)
Platelets: 140 10*3/uL — ABNORMAL LOW (ref 150–440)
RBC: 6.66 MIL/uL — ABNORMAL HIGH (ref 4.40–5.90)
RDW: 20.2 % — ABNORMAL HIGH (ref 11.5–14.5)
WBC: 17.3 10*3/uL — ABNORMAL HIGH (ref 3.8–10.6)

## 2015-10-29 LAB — GLUCOSE, CAPILLARY
GLUCOSE-CAPILLARY: 117 mg/dL — AB (ref 65–99)
GLUCOSE-CAPILLARY: 146 mg/dL — AB (ref 65–99)
Glucose-Capillary: 130 mg/dL — ABNORMAL HIGH (ref 65–99)
Glucose-Capillary: 141 mg/dL — ABNORMAL HIGH (ref 65–99)
Glucose-Capillary: 131 mg/dL — ABNORMAL HIGH (ref 65–99)
Glucose-Capillary: 134 mg/dL — ABNORMAL HIGH (ref 65–99)

## 2015-10-29 LAB — BLOOD GAS, ARTERIAL
ALLENS TEST (PASS/FAIL): POSITIVE — AB
Acid-Base Excess: 20.8 mmol/L — ABNORMAL HIGH (ref 0.0–3.0)
Bicarbonate: 50.8 mEq/L — ABNORMAL HIGH (ref 21.0–28.0)
FIO2: 0.99
O2 Saturation: 92.8 %
PEEP: 20 cmH2O
Patient temperature: 37
RATE: 18 resp/min
VT: 600 mL
pCO2 arterial: 84 mmHg (ref 32.0–48.0)
pH, Arterial: 7.39 (ref 7.350–7.450)
pO2, Arterial: 67 mmHg — ABNORMAL LOW (ref 83.0–108.0)

## 2015-10-29 LAB — CULTURE, RESPIRATORY W GRAM STAIN: Culture: NORMAL

## 2015-10-29 LAB — HEMOGLOBIN A1C: HEMOGLOBIN A1C: 6.4 % — AB (ref 4.0–6.0)

## 2015-10-29 LAB — HEPARIN LEVEL (UNFRACTIONATED): HEPARIN UNFRACTIONATED: 0.6 [IU]/mL (ref 0.30–0.70)

## 2015-10-29 LAB — CULTURE, RESPIRATORY

## 2015-10-29 LAB — PROCALCITONIN: PROCALCITONIN: 0.61 ng/mL

## 2015-10-29 MED ORDER — VITAL HIGH PROTEIN PO LIQD
1000.0000 mL | ORAL | Status: DC
Start: 2015-10-29 — End: 2015-10-30

## 2015-10-29 MED ORDER — AZITHROMYCIN 500 MG IV SOLR
500.0000 mg | INTRAVENOUS | Status: DC
  Administered 2015-10-29 – 2015-10-30 (×2): 500 mg via INTRAVENOUS
  Filled 2015-10-29 (×3): qty 500

## 2015-10-29 MED ORDER — POLYETHYLENE GLYCOL 3350 17 G PO PACK
17.0000 g | PACK | Freq: Every day | ORAL | Status: DC
  Administered 2015-10-29 – 2015-10-30 (×2): 17 g via ORAL
  Filled 2015-10-29 (×2): qty 1

## 2015-10-29 MED ORDER — FREE WATER
150.0000 mL | Status: DC
Start: 2015-10-29 — End: 2015-10-30
  Administered 2015-10-29 – 2015-10-30 (×5): 150 mL

## 2015-10-29 MED ORDER — METHYLPREDNISOLONE SODIUM SUCC 125 MG IJ SOLR
60.0000 mg | Freq: Three times a day (TID) | INTRAMUSCULAR | Status: DC
  Administered 2015-10-29 – 2015-10-30 (×4): 60 mg via INTRAVENOUS
  Filled 2015-10-29 (×4): qty 2

## 2015-10-29 NOTE — Progress Notes (Signed)
Pharmacy Consult for Electrolyte Management  No Known Allergies  Patient Measurements: Height:  (180.3 cm) Weight: (!) 567 lb 3.9 oz (257.3 kg) IBW/kg (Calculated) : 75.3   Vital Signs: Temp: 100.8 F (38.2 C) (12/02 0500) Temp Source: Oral (12/02 0500) BP: 120/63 mmHg (12/02 0500) Pulse Rate: 101 (12/02 0500) Intake/Output from previous day: 12/01 0701 - 12/02 0700 In: 2100.6 [I.V.:1350.4; NG/GT:550.3; IV Piggyback:200] Out: 2595 [Urine:1625; Emesis/NG output:970] Intake/Output from this shift: Total I/O In: 937.9 [I.V.:492.7; NG/GT:345.3; IV Piggyback:100] Out: 1225 [Urine:325; Emesis/NG output:900]  Labs:  Recent Labs  10/26/15 0646 10/27/15 0430 10/28/15 0526  WBC 10.8* 9.0 10.3  HGB 14.2 14.2 14.0  HCT 47.6 47.2 47.8  PLT 153 145* 148*     Recent Labs  10/26/15 0646  10/26/15 1110 10/26/15 2029 10/27/15 0430  10/28/15 0526 10/28/15 2010 10/29/15 0545  NA 144  --   --   --  145  < > 147* 146* 147*  K 2.6*  --   --  2.8* 3.1*  < > 3.5 3.5 3.8  CL 94*  --   --   --  93*  < > 94* 95* 96*  CO2 41*  --   --   --  43*  < > 43* 42* 46*  GLUCOSE 85  --   --   --  105*  < > 142* 127* 122*  BUN 45*  --   --   --  39*  < > 45* 54* 58*  CREATININE 1.42*  --   --   --  1.26*  < > 1.33* 1.31* 1.32*  CALCIUM 8.3*  --   --   --  8.2*  < > 8.3* 8.3* 8.3*  MG  --   < > 1.3* 2.0 1.7  --  1.9  --   --   PHOS  --   --  5.3*  --  5.3*  --   --   --   --   PROT 6.5  --   --   --   --   --   --   --   --   ALBUMIN 2.8*  --   --   --   --   --   --   --   --   AST 85*  --   --   --   --   --   --   --   --   ALT 147*  --   --   --   --   --   --   --   --   ALKPHOS 56  --   --   --   --   --   --   --   --   BILITOT 1.4*  --   --   --   --   --   --   --   --   TRIG  --   --   --   --   --   --   --  106  --   < > = values in this interval not displayed. Estimated Creatinine Clearance: 173 mL/min (by C-G formula based on Cr of 1.32).    Recent Labs   10/28/15 1136 10/28/15 1624 10/28/15 1927  GLUCAP 145* 151* 133*    Medical History: Past Medical History  Diagnosis Date  . Morbid obesity (HCC)   . Gout   . History of echocardiogram  a. a. echo 04/15/2015: EF 60-65%, no WMA, LA nl in size, RV poorly visualized, PASP nl, grossly nl study, challening study 2/2 body habitus   . CHF (congestive heart failure) (HCC)     Medications:  Scheduled:  . allopurinol  100 mg Per Tube Daily  . antiseptic oral rinse  7 mL Mouth Rinse QID  . aspirin  325 mg Per Tube Daily  . budesonide  0.25 mg Nebulization Q6H  . cefTAZidime (FORTAZ)  IV  2 g Intravenous 3 times per day  . chlorhexidine gluconate  15 mL Mouth Rinse BID  . famotidine (PEPCID) IV  20 mg Intravenous Q12H  . feeding supplement (PRO-STAT SUGAR FREE 64)  30 mL Oral 6 times per day  . free water  100 mL Per Tube 6 times per day  . ipratropium-albuterol  3 mL Nebulization Q6H  . magnesium oxide  400 mg Per Tube BID  . methylPREDNISolone (SOLU-MEDROL) injection  80 mg Intravenous Q8H  . senna-docusate  1 tablet Oral BID  . sodium chloride  3 mL Intravenous Q12H   Infusions:  . feeding supplement (VITAL HIGH PROTEIN) 1,000 mL (10/26/15 1302)  . furosemide (LASIX) infusion 8 mg/hr (10/28/15 1400)  . heparin 2,400 Units/hr (10/29/15 0533)  . propofol (DIPRIVAN) infusion 60 mcg/kg/min (10/29/15 0530)     Assessment: Pharmacy consulted to assist in managing electrolytes in this [name] with diastolic HF and morbid obesity.   Plan:  Potassium is low at 2.8 and being replaced by MD with 6 runs of 10 meq IV. Will recheck potassium level with am labs. Magnesium is WNL at 2.0.   11/30 K+ 3.1, PO4 slightly elevated. 20 mEq KCl per tube x 2 doses ordered. Recheck BMP in AM.  12/1 AM electrolyte panel, no replacement indicated. F/u BMP tomorrow AM.  12/2 AM electrolyte panel, no replacement indicated. F/u BMP tomorrow AM.  Fulton ReekMatt Rahkeem Senft, PharmD, BCPS 10/29/2015 6:24 AM

## 2015-10-29 NOTE — Progress Notes (Signed)
Nutrition Follow-up     INTERVENTION:   EN: recommend increasing TF to rate of 20 ml/hr, prostat 6 times, recommend increasing free water; continue to assess Coordination of Care: pt continues without BM, bowel regimen being adjusted per MD   NUTRITION DIAGNOSIS:   Inadequate oral intake related to acute illness as evidenced by NPO status.  GOAL:   Provide needs based on ASPEN/SCCM guidelines  MONITOR:    (Energy Intake, Pulmonary, Digestive System, Electrolyte/Renal Profile, Glucose Profile)  REASON FOR ASSESSMENT:   Ventilator, Consult Enteral/tube feeding initiation and management  ASSESSMENT:     Pt remains on vent  EN: tolerating Vital High Protein at rate of 15 ml/hr  Digestive System:   Electrolyte and Renal Profile:  Recent Labs Lab 10/26/15 1110 10/26/15 2029 10/27/15 0430  10/28/15 0526 10/28/15 2010 10/29/15 0545  BUN  --   --  39*  < > 45* 54* 58*  CREATININE  --   --  1.26*  < > 1.33* 1.31* 1.32*  NA  --   --  145  < > 147* 146* 147*  K  --  2.8* 3.1*  < > 3.5 3.5 3.8  MG 1.3* 2.0 1.7  --  1.9  --   --   PHOS 5.3*  --  5.3*  --   --   --   --   < > = values in this interval not displayed. Glucose Profile:  Recent Labs  10/28/15 1927 10/29/15 0334 10/29/15 1137  GLUCAP 133* 130* 117*    Skin:  Reviewed, no issues  Last BM:  11/28   Meds: diprivan (920 kcals in 24 hours per I/O documentation), lasix drip Height:   Ht Readings from Last 1 Encounters:  10/25/15 5\' 11"  (1.803 m)    Weight:   Wt Readings from Last 1 Encounters:  10/29/15 567 lb 3.9 oz (257.3 kg)    BMI:  Body mass index is 79.15 kg/(m^2).  Estimated Nutritional Needs:   Kcal:  1716-1950 kcals (22-25 kcals/kg) using IBW 78 kg  Protein:  156-195 g (2.0-2.5 g/kg)   Fluid:  1950-2340 mL (25-30 ml/kg)    HIGH Care Level  Theodore Starcherate Jigar Zielke MS, RD, LDN [phone] Pager

## 2015-10-29 NOTE — Progress Notes (Signed)
Patient: Theodore Reid / Admit Date: 10/25/2015 / Date of Encounter: 10/29/2015, 11:08 AM   Subjective: No significant events overnight, patient still on 100% FiO2 and PEEP of 12  Significant urine output. Weight down to 567 .   Review of Systems: ROS Unable to test, patient intubated and sedated  Objective: Telemetry: NSR Physical Exam: Blood pressure 120/63, pulse 101, temperature 100.8 F (38.2 C), temperature source Oral, resp. rate 19, height 5\' 11"  (1.803 m), weight 567 lb 3.9 oz (257.3 kg), SpO2 99 %. Body mass index is 79.15 kg/(m^2). General: Well developed, well nourished, morbidly obese, intubated Head: Normocephalic, atraumatic, sclera non-icteric, no xanthomas, nares are without discharge. Neck: Negative for carotid bruits. JVD not elevated. Lungs: Decreased bilaterally. Heart: RRR with S1 S2. No murmurs, rubs, or gallops appreciated. Abdomen: Morbidly obese, soft Msk: Unable to test Extremities: No clubbing or cyanosis. No edema. 1+ pitting edema to the thighs Neuro: Sedated, intubated Psych: Sedated intubated,   Intake/Output Summary (Last 24 hours) at 10/29/15 1108 Last data filed at 10/29/15 1000  Gross per 24 hour  Intake 2168.82 ml  Output   2163 ml  Net   5.82 ml    Inpatient Medications:  . allopurinol  100 mg Per Tube Daily  . antiseptic oral rinse  7 mL Mouth Rinse QID  . aspirin  325 mg Per Tube Daily  . budesonide  0.25 mg Nebulization Q6H  . cefTAZidime (FORTAZ)  IV  2 g Intravenous 3 times per day  . chlorhexidine gluconate  15 mL Mouth Rinse BID  . famotidine (PEPCID) IV  20 mg Intravenous Q12H  . feeding supplement (PRO-STAT SUGAR FREE 64)  30 mL Oral 6 times per day  . free water  100 mL Per Tube 6 times per day  . ipratropium-albuterol  3 mL Nebulization Q6H  . magnesium oxide  400 mg Per Tube BID  . methylPREDNISolone (SOLU-MEDROL) injection  80 mg Intravenous Q8H  . senna-docusate  1 tablet Oral BID  . sodium chloride   3 mL Intravenous Q12H   Infusions:  . feeding supplement (VITAL HIGH PROTEIN) 1,000 mL (10/26/15 1302)  . furosemide (LASIX) infusion 8 mg/hr (10/28/15 1400)  . heparin 2,400 Units/hr (10/29/15 0533)  . propofol (DIPRIVAN) infusion 50 mcg/kg/min (10/29/15 1004)    Labs:  Recent Labs  10/26/15 1110  10/27/15 0430  10/28/15 0526 10/28/15 2010 10/29/15 0545  NA  --   --  145  < > 147* 146* 147*  K  --   < > 3.1*  < > 3.5 3.5 3.8  CL  --   --  93*  < > 94* 95* 96*  CO2  --   --  43*  < > 43* 42* 46*  GLUCOSE  --   --  105*  < > 142* 127* 122*  BUN  --   --  39*  < > 45* 54* 58*  CREATININE  --   --  1.26*  < > 1.33* 1.31* 1.32*  CALCIUM  --   --  8.2*  < > 8.3* 8.3* 8.3*  MG 1.3*  < > 1.7  --  1.9  --   --   PHOS 5.3*  --  5.3*  --   --   --   --   < > = values in this interval not displayed. No results for input(s): AST, ALT, ALKPHOS, BILITOT, PROT, ALBUMIN in the last 72 hours.  Recent Labs  10/28/15 [phone]  10/29/15 0545  WBC 10.3 17.3*  HGB 14.0 14.1  HCT 47.8 48.1  MCV 72.2* 72.2*  PLT 148* 140*   No results for input(s): CKTOTAL, CKMB, TROPONINI in the last 72 hours. Invalid input(s): POCBNP No results for input(s): HGBA1C in the last 72 hours.   Weights: Filed Weights   10/27/15 0330 10/28/15 0700 10/29/15 0338  Weight: 580 lb 0.5 oz (263.1 kg) 580 lb 0.5 oz (263.1 kg) 567 lb 3.9 oz (257.3 kg)     Radiology/Studies:     US Venous Img Lower Bilateral  10/26/2015   IMPRESSION: No evidence of deep venous thrombosis. Electronically Signed   By: Malachy Moan M.D.   On: 10/26/2015 15:11   Dg Chest Port 1 View  10/26/2015 IMPRESSION: Study is markedly limited by patient's large body habitus and poor inspiration. Persistent central vascular congestion mild interstitial prominence bilateral suspicious for pulmonary edema. Bilateral basilar atelectasis or infiltrate again noted. Stable endotracheal tube position. Electronically Signed   By: Natasha Mead M.D.    On: 10/26/2015 08:04     Assessment and Plan  29 y.o. male   1. Acute hypoxic and hypercarbic respiratory failure: -Likely multifactorial in the setting of acute on chronic diastolic CHF and morbid obesity hypoventilation syndrome,  -On 100% FiO2  Weight down to 567 lbs (from 620 ?) not sure if accurate.  -  He he is currently on Lasix drip at 8 mg per hour. Due to his morbid obesity, it's difficult to accurately estimate his volume status. Continue to monitor renal function closely as at some point we have to stop diuresis. Unfortunately, he continues to be on maximal vent support. - overall prognosis seem very poor.   2. Acute on chronic diastolic CHF: Would continue aggressive diuresis  3. Morbid obesity hypoventilation syndrome Pulmonary following. Will probably require longterm vent/trach.  Consider palliative care consult.    Signed, Lorine Bears, MD  [facility] 10/29/2015, 11:08 AM

## 2015-10-29 NOTE — Progress Notes (Signed)
[facility]  Critical Care Medicine Progess Note    ASSESSMENT/PLAN   This is a 29 year old male with super obesity, congestive heart failure, obesity hypoventilation syndrome. He presents with acute respiratory failure with severe hypoxia, thought to be secondary to acute CHF exacerbation.  PULMONARY A: Acute on chronic hypoxic and hypercarbic respiratory failure Acute pulmonary edema secondary to acute diastolic congestive heart failure. ?ARDS-appears unlikely given fairly good lung compliance. On empiric steroids.  ?Pneumonia--on empiric abx.  OHS/OSA Smoker Bronchospasm Acute on chronic hypercapnia, bicarb and CO2 increasing due to diuresis.    P:  -Continue lasix drip today, may consider converting as creatine increases.  Vent bundle implemented Continue periodic recruitment maneuvers.  Continue daily wake up assessments.  --May need paralytics if develops worsening hypoxemia.  Scheduled nebulized steroids Scheduled and PRN nebulized bronchodilators Empiric full dose heparin--LE Dopplers negative.  Empiric moderate dose steroids.   CARDIOVASCULAR A:  Diastolic HF  P:  Continue diuresis, minimize IV fluids.  RENAL A:  Hypervolemia, likely chronic AKI (baseline Cr 0.84) P:  Kidney injury, slightly improved from admission.  GASTROINTESTINAL A:  Morbid obesity P:  SUP: IV famotidine   HEMATOLOGIC A:  No issues P:  DVT px: full dose heparin  Monitor CBC intermittently Transfuse per usual guidelines  INFECTIOUS A:  No identified infections P:  Monitor temp, WBC count Micro and abx as above PCT protocol ordered 11/28  Sputum culture 10/26/2015; pending Ceftaz . 12-1>>  ENDOCRINE A:  Mild hyperglycemia without documented history of DM P:  Monitor glu on chem panels SSI   NEUROLOGIC A:  Acute encephalopathy of critical illness Intermittent agitation with ventilator dyssynchrony P:  RASS goal: -2, -3 Propofol  gtt PRN fentanyl PRN vecuronium for vent dyssynch   Chest x-ray 10/27/2015; significantly improved pulmonary edema, morbid obesity. LE dopplers 10/26/15: negative for DVT.  Echo 10/26/15; EF=55%; biventricular dilatation.   ---------------------------------------   ----------------------------------------   Name: Loma NewtonMichael J Picone MRN: [id number] DOB: 1985-12-19    ADMISSION DATE:  10/25/2015    SUBJECTIVE/review of systems:   Pt currently on the ventilator, can not provide history or review of systems.     VITAL SIGNS: Temp:  [100.3 F (37.9 C)-101.1 F (38.4 C)] 100.8 F (38.2 C) (12/02 0500) Pulse Rate:  [94-103] 101 (12/02 0500) Resp:  [18-21] 19 (12/02 0500) BP: (111-123)/(53-69) 120/63 mmHg (12/02 0500) SpO2:  [86 %-98 %] 98 % (12/02 0500) FiO2 (%):  [100 %] 100 % (12/02 0756) Weight:  [257.3 kg (567 lb 3.9 oz)] 257.3 kg (567 lb 3.9 oz) (12/02 0338) HEMODYNAMICS:   VENTILATOR SETTINGS: Vent Mode:  [-] PRVC FiO2 (%):  [100 %] 100 % Set Rate:  [18 bmp] 18 bmp Vt Set:  [600 mL] 600 mL PEEP:  [20 cmH20-35 cmH20] 35 cmH20 Plateau Pressure:  [33 cmH20] 33 cmH20 INTAKE / OUTPUT:  Intake/Output Summary (Last 24 hours) at 10/29/15 0857 Last data filed at 10/29/15 0735  Gross per 24 hour  Intake 2428.92 ml  Output   2163 ml  Net 265.92 ml    PHYSICAL EXAMINATION: Physical Examination:   VS: BP 120/63 mmHg  Pulse 101  Temp(Src) 100.8 F (38.2 C) (Oral)  Resp 19  Ht 5\' 11"  (1.803 m)  Wt 257.3 kg (567 lb 3.9 oz)  BMI 79.15 kg/m2  SpO2 98%  General Appearance: On ventilator Neuro:without focal findings, mental status normal. HEENT: PERRLA, EOM intact. Pulmonary: normal breath sounds   CardiovascularNormal S1,S2.  No m/r/g.   Abdomen: Benign, Soft, non-tender.  Renal:  No costovertebral tenderness  GU:  Not performed at this time. Endocrine: No evident thyromegaly. Skin:   warm, no rashes, no ecchymosis  Extremities: normal, no cyanosis,  clubbing.   LABS:   LABORATORY PANEL:   CBC  Recent Labs Lab 10/29/15 0545  WBC 17.3*  HGB 14.1  HCT 48.1  PLT 140*    Chemistries   Recent Labs Lab 10/26/15 0646  10/27/15 0430  10/28/15 0526  10/29/15 0545  NA 144  --  145  < > 147*  < > 147*  K 2.6*  < > 3.1*  < > 3.5  < > 3.8  CL 94*  --  93*  < > 94*  < > 96*  CO2 41*  --  43*  < > 43*  < > 46*  GLUCOSE 85  --  105*  < > 142*  < > 122*  BUN 45*  --  39*  < > 45*  < > 58*  CREATININE 1.42*  --  1.26*  < > 1.33*  < > 1.32*  CALCIUM 8.3*  --  8.2*  < > 8.3*  < > 8.3*  MG  --   < > 1.7  --  1.9  --   --   PHOS  --   < > 5.3*  --   --   --   --   AST 85*  --   --   --   --   --   --   ALT 147*  --   --   --   --   --   --   ALKPHOS 56  --   --   --   --   --   --   BILITOT 1.4*  --   --   --   --   --   --   < > = values in this interval not displayed.   Recent Labs Lab 10/28/15 0544 10/28/15 0735 10/28/15 1136 10/28/15 1624 10/28/15 1927 10/29/15 0334  GLUCAP 145* 166* 145* 151* 133* 130*    Recent Labs Lab 10/27/15 0500 10/28/15 0414 10/29/15 0450  PHART 7.46* 7.42 7.39  PCO2ART 67* 74* 84*  PO2ART 58* 77* 67*    Recent Labs Lab 10/26/15 0646  AST 85*  ALT 147*  ALKPHOS 56  BILITOT 1.4*  ALBUMIN 2.8*    Cardiac Enzymes  Recent Labs Lab 10/25/15 1138  TROPONINI <0.03    RADIOLOGY:  Dg Chest 1 View  10/29/2015  CLINICAL DATA:  Shortness of breath. EXAM: CHEST 1 VIEW FINDINGS: Endotracheal tube and left IJ line in stable position. Cardiomegaly with persistent bilateral pulmonary infiltrates suggesting congestive heart failure with persistent pulmonary edema. Bilateral pneumonia cannot be excluded. Small pleural effusions cannot be excluded. No pneumothorax. IMPRESSION: 1. Lines and tubes in stable position. 2. Severe cardiomegaly with persistent bilateral pulmonary alveolar infiltrates, right side greater left. Findings suggest congestive heart failure. Bilateral pneumonia cannot be  excluded. Small pleural effusions cannot be excluded. No significant improvement from prior exam. Electronically Signed   By: Maisie Fus  Register   On: 10/29/2015 07:17   Dg Chest 1 View  10/28/2015  CLINICAL DATA:  CHF, respiratory failure, ARDS. EXAM: CHEST 1 VIEW COMPARISON:  Portable chest x-ray of October 27, 2015 FINDINGS: The lungs are adequately inflated. The interstitial markings are increased in the mid and lower lung zones and there is confluent alveolar opacity at both bases greater on the right than  on the left. The hemidiaphragms remain partially obscured but are better demonstrated today. The cardiac silhouette remains enlarged. The pulmonary vascularity remains engorged. An esophagogastric tube is visible. The endotracheal tube if present appears to be positioned somewhat superiorly lie at least 2 cm above the superior margin of the clavicular heads and 12 cm above the carina. The esophagogastric tube cannot be followed beyond the mid thorax. IMPRESSION: Slight interval improvement in bilateral alveolar opacities may reflect resolving pulmonary edema or pneumonia. Stable cardiomegaly and central pulmonary vascular congestion. High positioning of the endotracheal tube 12 cm above the carina. Advancement by 4-5 cm is recommended. Electronically Signed   By: David  [place] M.D.   On: 10/28/2015 07:24       --Wells Guiles, MD.  [facility]   Santiago Glad, M.D.  Stephanie Acre, M.D.  Billy Fischer, M.D   Critical Care Attestation.  I have personally obtained a history, examined the patient, evaluated laboratory and imaging results, formulated the assessment and plan and placed orders. The Patient requires high complexity decision making for assessment and support, frequent evaluation and titration of therapies, application of advanced monitoring technologies and extensive interpretation of multiple databases. The patient has critical illness that could lead  imminently to failure of 1 or more organ systems and requires the highest level of physician preparedness to intervene.  Critical Care Time devoted to patient care services described in this note is 35 minutes and is exclusive of time spent in procedures.

## 2015-10-29 NOTE — Progress Notes (Signed)
Patient ID: Theodore Reid, male   DOB: [birth year], 29 y.o.   MRN: [id number] [facility] PROGRESS NOTE  PCP: Margaretann Loveless, PA-C  HPI/Subjective: Patient intubated and sedated. As per the nurse, when the patient was titrated down on sedation today he was following commands.  Objective: Filed Vitals:   10/29/15 1200 10/29/15 1300  BP: 118/60 117/65  Pulse: 97 106  Temp: 100.6 F (38.1 C)   Resp: 19 23    Filed Weights   10/27/15 0330 10/28/15 0700 10/29/15 0338  Weight: 263.1 kg (580 lb 0.5 oz) 263.1 kg (580 lb 0.5 oz) 257.3 kg (567 lb 3.9 oz)    ROS: Review of Systems  Unable to perform ROS  patient on the ventilator Exam: Physical Exam  Constitutional: He is intubated.  HENT:  Head: Normocephalic.  Nose: No mucosal edema.  Eyes:  Pupils pinpoint  Neck: Carotid bruit is not present. No thyromegaly present.  Cardiovascular: Regular rhythm, S1 normal and S2 normal.   Pulses:      Dorsalis pedis pulses are 1+ on the right side, and 1+ on the left side.  Respiratory: No accessory muscle usage. He is intubated. He has decreased breath sounds in the right middle field, the right lower field, the left middle field and the left lower field. He has rales in the right lower field and the left lower field.  GI: Soft. Bowel sounds are normal. There is no tenderness.  Musculoskeletal:       Right ankle: He exhibits swelling.       Left ankle: He exhibits swelling.  Lymphadenopathy:    He has no cervical adenopathy.  Neurological:  Sedated on the ventilator  Skin:  Anteriorly no ulcers  Psychiatric:  Unable to assess since the patient is on the ventilator    Data Reviewed: Basic Metabolic Panel:  Recent Labs Lab 10/26/15 1110 10/26/15 2029 10/27/15 0430 10/27/15 1938 10/28/15 0526 10/28/15 2010 10/29/15 0545  NA  --   --  145 145 147* 146* 147*  K  --  2.8* 3.1* 3.8 3.5 3.5 3.8  CL  --   --  93* 93* 94* 95* 96*  CO2  --   --  43* 43* 43* 42*  46*  GLUCOSE  --   --  105* 131* 142* 127* 122*  BUN  --   --  39* 40* 45* 54* 58*  CREATININE  --   --  1.26* 1.26* 1.33* 1.31* 1.32*  CALCIUM  --   --  8.2* 8.3* 8.3* 8.3* 8.3*  MG 1.3* 2.0 1.7  --  1.9  --   --   PHOS 5.3*  --  5.3*  --   --   --   --    Liver Function Tests:  Recent Labs Lab 10/26/15 0646  AST 85*  ALT 147*  ALKPHOS 56  BILITOT 1.4*  PROT 6.5  ALBUMIN 2.8*   CBC:  Recent Labs Lab 10/25/15 1138 10/26/15 0646 10/27/15 0430 10/28/15 0526 10/29/15 0545  WBC 11.7* 10.8* 9.0 10.3 17.3*  NEUTROABS 7.2*  --   --   --   --   HGB 14.7 14.2 14.2 14.0 14.1  HCT 49.5 47.6 47.2 47.8 48.1  MCV 70.5* 70.7* 70.6* 72.2* 72.2*  PLT 187 153 145* 148* 140*   Cardiac Enzymes:  Recent Labs Lab 10/25/15 1138  TROPONINI <0.03   BNP (last 3 results)  Recent Labs  10/25/15 1138  BNP 934.0*  Studies: Dg Chest 1 View  10/29/2015  CLINICAL DATA:  Shortness of breath. EXAM: CHEST 1 VIEW FINDINGS: Endotracheal tube and left IJ line in stable position. Cardiomegaly with persistent bilateral pulmonary infiltrates suggesting congestive heart failure with persistent pulmonary edema. Bilateral pneumonia cannot be excluded. Small pleural effusions cannot be excluded. No pneumothorax. IMPRESSION: 1. Lines and tubes in stable position. 2. Severe cardiomegaly with persistent bilateral pulmonary alveolar infiltrates, right side greater left. Findings suggest congestive heart failure. Bilateral pneumonia cannot be excluded. Small pleural effusions cannot be excluded. No significant improvement from prior exam. Electronically Signed   By: Maisie Fushomas  Register   On: 10/29/2015 07:17   Dg Chest 1 View  10/28/2015  CLINICAL DATA:  CHF, respiratory failure, ARDS. EXAM: CHEST 1 VIEW COMPARISON:  Portable chest x-ray of October 27, 2015 FINDINGS: The lungs are adequately inflated. The interstitial markings are increased in the mid and lower lung zones and there is confluent alveolar  opacity at both bases greater on the right than on the left. The hemidiaphragms remain partially obscured but are better demonstrated today. The cardiac silhouette remains enlarged. The pulmonary vascularity remains engorged. An esophagogastric tube is visible. The endotracheal tube if present appears to be positioned somewhat superiorly lie at least 2 cm above the superior margin of the clavicular heads and 12 cm above the carina. The esophagogastric tube cannot be followed beyond the mid thorax. IMPRESSION: Slight interval improvement in bilateral alveolar opacities may reflect resolving pulmonary edema or pneumonia. Stable cardiomegaly and central pulmonary vascular congestion. High positioning of the endotracheal tube 12 cm above the carina. Advancement by 4-5 cm is recommended. Electronically Signed   By: David  SwazilandJordan M.D.   On: 10/28/2015 07:24    Scheduled Meds: . allopurinol  100 mg Per Tube Daily  . antiseptic oral rinse  7 mL Mouth Rinse QID  . aspirin  325 mg Per Tube Daily  . azithromycin  500 mg Intravenous Q24H  . budesonide  0.25 mg Nebulization Q6H  . cefTAZidime (FORTAZ)  IV  2 g Intravenous 3 times per day  . chlorhexidine gluconate  15 mL Mouth Rinse BID  . famotidine (PEPCID) IV  20 mg Intravenous Q12H  . feeding supplement (PRO-STAT SUGAR FREE 64)  30 mL Oral 6 times per day  . free water  150 mL Per Tube 6 times per day  . ipratropium-albuterol  3 mL Nebulization Q6H  . magnesium oxide  400 mg Per Tube BID  . methylPREDNISolone (SOLU-MEDROL) injection  60 mg Intravenous Q8H  . polyethylene glycol  17 g Oral Daily  . senna-docusate  1 tablet Oral BID  . sodium chloride  3 mL Intravenous Q12H   Continuous Infusions: . feeding supplement (VITAL HIGH PROTEIN) 1,000 mL (10/29/15 1330)  . furosemide (LASIX) infusion 8 mg/hr (10/29/15 1220)  . heparin 2,400 Units/hr (10/29/15 1220)  . propofol (DIPRIVAN) infusion 50 mcg/kg/min (10/29/15 1220)    Assessment/Plan:  1.  Acute respiratory failure with hypoxia. Patient currently on the ventilator requiring 100% FiO2. Appreciate critical care specialist consultation. Patient put on empiric antibiotics and steroids. Any little movement to the patient he desaturates quickly. Overall prognosis is poor. 2. Acute diastolic congestive heart failure. Patient is now on IV Lasix drip. The patient is getting a lot of IV fluids with the heparin drip and sedation. The patient admission weight is 284 kg and he is now down to 257 kg. 3. Hypomagnesemia and hypokalemia- patient is on electrolyte protocol in the ICU. Magnesium  and potassium supplementation underway. Check electrolytes daily 4. Morbid obesity, likely sleep apnea. Weight loss needed 5. Patient is empirically on heparin just in case blood clot. Watch platelet count closely as it starting to drop down. 6. Fever- patient empirically on ceftazidime. Added Zithromax today. Get blood cultures 2, sputum culture growing moderate gram-positive cocci in pairs and chains. 7. Nutrition-  continue tube feeding  Code Status:     Code Status Orders        Start     Ordered   10/25/15 1805  Full code   Continuous     10/25/15 1804     Family Communication: spoke with the mother at the bedside Disposition Plan: to be determined  Consultants:  Critical care specialist  Procedures:  ET tube  Time spent: 31 minutes critical care time  Alford Highland  [facility]

## 2015-10-30 ENCOUNTER — Inpatient Hospital Stay (HOSPITAL_COMMUNITY)
Admission: AD | Admit: 2015-10-30 | Discharge: 2015-11-28 | DRG: 871 | Disposition: E | Payer: Medicaid Other | Source: Other Acute Inpatient Hospital | Attending: Pulmonary Disease | Admitting: Pulmonary Disease

## 2015-10-30 ENCOUNTER — Inpatient Hospital Stay (HOSPITAL_COMMUNITY): Payer: Medicaid Other

## 2015-10-30 DIAGNOSIS — Z87891 Personal history of nicotine dependence: Secondary | ICD-10-CM | POA: Diagnosis not present

## 2015-10-30 DIAGNOSIS — M109 Gout, unspecified: Secondary | ICD-10-CM | POA: Diagnosis present

## 2015-10-30 DIAGNOSIS — K921 Melena: Secondary | ICD-10-CM | POA: Diagnosis present

## 2015-10-30 DIAGNOSIS — E2740 Unspecified adrenocortical insufficiency: Secondary | ICD-10-CM | POA: Diagnosis present

## 2015-10-30 DIAGNOSIS — A419 Sepsis, unspecified organism: Secondary | ICD-10-CM | POA: Diagnosis present

## 2015-10-30 DIAGNOSIS — K922 Gastrointestinal hemorrhage, unspecified: Secondary | ICD-10-CM | POA: Diagnosis present

## 2015-10-30 DIAGNOSIS — E874 Mixed disorder of acid-base balance: Secondary | ICD-10-CM | POA: Diagnosis present

## 2015-10-30 DIAGNOSIS — Z7982 Long term (current) use of aspirin: Secondary | ICD-10-CM | POA: Diagnosis not present

## 2015-10-30 DIAGNOSIS — I4510 Unspecified right bundle-branch block: Secondary | ICD-10-CM | POA: Diagnosis present

## 2015-10-30 DIAGNOSIS — I5033 Acute on chronic diastolic (congestive) heart failure: Secondary | ICD-10-CM | POA: Diagnosis present

## 2015-10-30 DIAGNOSIS — I4891 Unspecified atrial fibrillation: Secondary | ICD-10-CM | POA: Diagnosis present

## 2015-10-30 DIAGNOSIS — I469 Cardiac arrest, cause unspecified: Secondary | ICD-10-CM

## 2015-10-30 DIAGNOSIS — J9601 Acute respiratory failure with hypoxia: Secondary | ICD-10-CM | POA: Diagnosis present

## 2015-10-30 DIAGNOSIS — J9600 Acute respiratory failure, unspecified whether with hypoxia or hypercapnia: Secondary | ICD-10-CM | POA: Diagnosis present

## 2015-10-30 DIAGNOSIS — R6521 Severe sepsis with septic shock: Secondary | ICD-10-CM | POA: Diagnosis present

## 2015-10-30 DIAGNOSIS — R570 Cardiogenic shock: Secondary | ICD-10-CM | POA: Diagnosis present

## 2015-10-30 DIAGNOSIS — J189 Pneumonia, unspecified organism: Secondary | ICD-10-CM | POA: Diagnosis present

## 2015-10-30 DIAGNOSIS — N179 Acute kidney failure, unspecified: Secondary | ICD-10-CM | POA: Diagnosis present

## 2015-10-30 DIAGNOSIS — E876 Hypokalemia: Secondary | ICD-10-CM | POA: Diagnosis present

## 2015-10-30 DIAGNOSIS — Z79899 Other long term (current) drug therapy: Secondary | ICD-10-CM | POA: Diagnosis not present

## 2015-10-30 DIAGNOSIS — I472 Ventricular tachycardia: Secondary | ICD-10-CM | POA: Diagnosis present

## 2015-10-30 DIAGNOSIS — R0602 Shortness of breath: Secondary | ICD-10-CM | POA: Diagnosis present

## 2015-10-30 LAB — BLOOD GAS, ARTERIAL
Acid-Base Excess: 3.7 mmol/L — ABNORMAL HIGH (ref 0.0–2.0)
BICARBONATE: 29.9 meq/L — AB (ref 20.0–24.0)
Drawn by: 24513
FIO2: 100
LHR: 15 {breaths}/min
MECHVT: 600 mL
O2 SAT: 98.6 %
PATIENT TEMPERATURE: 98.6
PEEP: 20 cmH2O
PO2 ART: 153 mmHg — AB (ref 80.0–100.0)
TCO2: 31.9 mmol/L (ref 0–100)
pCO2 arterial: 64.7 mmHg (ref 35.0–45.0)
pH, Arterial: 7.286 — ABNORMAL LOW (ref 7.350–7.450)

## 2015-10-30 LAB — GLUCOSE, CAPILLARY
GLUCOSE-CAPILLARY: 124 mg/dL — AB (ref 65–99)
GLUCOSE-CAPILLARY: 135 mg/dL — AB (ref 65–99)
Glucose-Capillary: 127 mg/dL — ABNORMAL HIGH (ref 65–99)
Glucose-Capillary: 139 mg/dL — ABNORMAL HIGH (ref 65–99)
Glucose-Capillary: 150 mg/dL — ABNORMAL HIGH (ref 65–99)
Glucose-Capillary: 129 mg/dL — ABNORMAL HIGH (ref 65–99)

## 2015-10-30 LAB — COMPREHENSIVE METABOLIC PANEL
ALBUMIN: 2.8 g/dL — AB (ref 3.5–5.0)
ALT: 46 U/L (ref 17–63)
ANION GAP: 9 (ref 5–15)
AST: 29 U/L (ref 15–41)
Alkaline Phosphatase: 40 U/L (ref 38–126)
BUN: 73 mg/dL — ABNORMAL HIGH (ref 6–20)
CHLORIDE: 99 mmol/L — AB (ref 101–111)
CO2: 43 mmol/L — AB (ref 22–32)
CREATININE: 1.39 mg/dL — AB (ref 0.61–1.24)
Calcium: 8.7 mg/dL — ABNORMAL LOW (ref 8.9–10.3)
GFR calc non Af Amer: >60 mL/min (ref >60)
GLUCOSE: 130 mg/dL — AB (ref 65–99)
Potassium: 3.9 mmol/L (ref 3.5–5.1)
SODIUM: 151 mmol/L — AB (ref 135–145)
Total Bilirubin: 0.7 mg/dL (ref 0.3–1.2)
Total Protein: 6.7 g/dL (ref 6.5–8.1)

## 2015-10-30 LAB — CBC
HCT: 46.5 % (ref 40.0–52.0)
Hemoglobin: 13.4 g/dL (ref 13.0–18.0)
MCH: 20.8 pg — ABNORMAL LOW (ref 26.0–34.0)
MCHC: 29 g/dL — ABNORMAL LOW (ref 32.0–36.0)
MCV: 71.8 fL — ABNORMAL LOW (ref 80.0–100.0)
Platelets: 136 10*3/uL — ABNORMAL LOW (ref 150–440)
RBC: 6.47 MIL/uL — ABNORMAL HIGH (ref 4.40–5.90)
RDW: 20.7 % — ABNORMAL HIGH (ref 11.5–14.5)
WBC: 15.7 10*3/uL — ABNORMAL HIGH (ref 3.8–10.6)

## 2015-10-30 LAB — HEPARIN LEVEL (UNFRACTIONATED)
HEPARIN UNFRACTIONATED: 0.72 [IU]/mL — AB (ref 0.30–0.70)
HEPARIN UNFRACTIONATED: 0.85 [IU]/mL — AB (ref 0.30–0.70)
Heparin Unfractionated: 0.8 IU/mL — ABNORMAL HIGH (ref 0.30–0.70)

## 2015-10-30 LAB — PROCALCITONIN: Procalcitonin: 0.43 ng/mL

## 2015-10-30 LAB — PROTIME-INR
INR: 1.33
Prothrombin Time: 16.6 seconds — ABNORMAL HIGH (ref 11.4–15.0)

## 2015-10-30 MED ORDER — TENECTEPLASE 50 MG IV KIT
50.0000 mg | PACK | INTRAVENOUS | Status: DC
  Filled 2015-10-30: qty 10

## 2015-10-30 MED ORDER — PANTOPRAZOLE SODIUM 40 MG IV SOLR
40.0000 mg | Freq: Every day | INTRAVENOUS | Status: DC
  Administered 2015-10-31: 40 mg via INTRAVENOUS
  Filled 2015-10-30 (×2): qty 40

## 2015-10-30 MED ORDER — DEXTROSE 5 % IV SOLN
500.0000 mg | INTRAVENOUS | Status: DC
  Filled 2015-10-30: qty 500

## 2015-10-30 MED ORDER — HEPARIN (PORCINE) IN NACL 100-0.45 UNIT/ML-% IJ SOLN
1800.0000 [IU]/h | INTRAMUSCULAR | Status: DC
  Filled 2015-10-30: qty 250

## 2015-10-30 MED ORDER — LIDOCAINE IN D5W 4-5 MG/ML-% IV SOLN
1.0000 mg/min | INTRAVENOUS | Status: DC
  Administered 2015-10-31: 1 mg/min via INTRAVENOUS
  Filled 2015-10-30: qty 500

## 2015-10-30 MED ORDER — IBUPROFEN 100 MG/5ML PO SUSP
600.0000 mg | Freq: Once | ORAL | Status: AC
  Administered 2015-10-30: 600 mg via ORAL
  Filled 2015-10-30: qty 30

## 2015-10-30 MED ORDER — STERILE WATER FOR INJECTION IV SOLN
INTRAVENOUS | Status: DC
  Administered 2015-10-31: 02:00:00 via INTRAVENOUS
  Filled 2015-10-30 (×4): qty 850

## 2015-10-30 MED ORDER — ASPIRIN 300 MG RE SUPP: 300.0000 mg | RECTAL | Status: AC

## 2015-10-30 MED ORDER — SODIUM BICARBONATE 8.4 % IV SOLN
INTRAVENOUS | Status: AC
  Administered 2015-10-30: 23:00:00
  Filled 2015-10-30: qty 150

## 2015-10-30 MED ORDER — SODIUM CHLORIDE 0.9 % IJ SOLN: 3.0000 mL | INTRAMUSCULAR | Status: DC | PRN

## 2015-10-30 MED ORDER — FREE WATER
175.0000 mL | Status: DC
  Administered 2015-10-30 (×2): 175 mL

## 2015-10-30 MED ORDER — SODIUM CHLORIDE 0.9 % IJ SOLN
3.0000 mL | Freq: Two times a day (BID) | INTRAMUSCULAR | Status: DC
  Administered 2015-10-30: 3 mL via INTRAVENOUS

## 2015-10-30 MED ORDER — FUROSEMIDE 10 MG/ML IJ SOLN
8.0000 mg/h | INTRAVENOUS | Status: DC
  Filled 2015-10-30: qty 25

## 2015-10-30 MED ORDER — CETYLPYRIDINIUM CHLORIDE 0.05 % MT LIQD
7.0000 mL | Freq: Two times a day (BID) | OROMUCOSAL | Status: DC
  Administered 2015-10-30: 7 mL via OROMUCOSAL

## 2015-10-30 MED ORDER — SODIUM CHLORIDE 0.9 % IV SOLN: 250.0000 mL | INTRAVENOUS | Status: DC | PRN

## 2015-10-30 MED ORDER — VASOPRESSIN 20 UNIT/ML IV SOLN
0.0300 [IU]/min | INTRAVENOUS | Status: DC
  Administered 2015-10-31: 0.03 [IU]/min via INTRAVENOUS
  Filled 2015-10-30 (×2): qty 2

## 2015-10-30 MED ORDER — DEXTROSE 5 % IV SOLN
1.0000 g | Freq: Two times a day (BID) | INTRAVENOUS | Status: DC
  Filled 2015-10-30 (×2): qty 10

## 2015-10-30 MED ORDER — AMIODARONE HCL IN DEXTROSE 360-4.14 MG/200ML-% IV SOLN
INTRAVENOUS | Status: AC
  Administered 2015-10-31: 200 mL
  Filled 2015-10-30: qty 200

## 2015-10-30 MED ORDER — EPINEPHRINE HCL 0.1 MG/ML IJ SOSY
PREFILLED_SYRINGE | INTRAMUSCULAR | Status: AC
  Administered 2015-10-30
  Filled 2015-10-30: qty 30

## 2015-10-30 MED ORDER — ASPIRIN 81 MG PO CHEW
324.0000 mg | CHEWABLE_TABLET | ORAL | Status: AC
  Administered 2015-10-31: 324 mg via ORAL
  Filled 2015-10-30: qty 4

## 2015-10-30 MED ORDER — SODIUM CHLORIDE 0.9 % IV SOLN: INTRAVENOUS | Status: DC | PRN

## 2015-10-30 MED ORDER — EPINEPHRINE HCL 1 MG/ML IJ SOLN
0.5000 ug/min | INTRAVENOUS | Status: DC
  Administered 2015-10-31: 150 ug/min via INTRAVENOUS
  Administered 2015-10-31 (×2): 20 ug/min via INTRAVENOUS
  Filled 2015-10-30 (×7): qty 4

## 2015-10-30 NOTE — Progress Notes (Signed)
Pt. Temp increased from 101 to 102 after receiving acetaminophen 650 mg (see EMR for details).  Spoke with Dr. Dimas AguasHoward who ordered IB Profen, ice packs were placed.  Will continue to monitor pt. Closely.

## 2015-10-30 NOTE — Progress Notes (Signed)
Patient ID: Theodore NewtonMichael J Reid, male   DOB: December 20, 1985, 29 y.o.   [id number] [facility]  PCP: Margaretann LovelessJennifer M Burnette, PA-C  HPI/Subjective: Patient remains on the ventilator. Sedated   Objective: Filed Vitals:   Aug 18, 2015 0600 Aug 18, 2015 0700  BP: 103/53 109/60  Pulse: 91 93  Temp:    Resp: 18 18    Filed Weights   10/28/15 0700 10/29/15 0338 Aug 18, 2015 0200  Weight: 263.1 kg (580 lb 0.5 oz) 257.3 kg (567 lb 3.9 oz) 257.4 kg (567 lb 7.4 oz)    ROS: Review of Systems  Unable to perform ROS  patient on the ventilator Exam: Physical Exam  Constitutional: He is intubated.  HENT:  Head: Normocephalic.  Nose: No mucosal edema.  Eyes:  Pupils pinpoint  Neck: Carotid bruit is not present. No thyromegaly present.  Cardiovascular: Regular rhythm, S1 normal and S2 normal.   Pulses:      Dorsalis pedis pulses are 1+ on the right side, and 1+ on the left side.  Respiratory: No accessory muscle usage. He is intubated. He has decreased breath sounds throughout both lung fields due to his morbid obesity heart appreciated breath sounds appropriately  GI: Soft. Bowel sounds are normal. There is no tenderness.  Musculoskeletal:       Right ankle: He exhibits swelling.       Left ankle: He exhibits swelling.  Lymphadenopathy:    He has no cervical adenopathy.  Neurological:  Sedated on the ventilator  Skin:  Anteriorly no ulcers  Psychiatric:  Unable to assess due to patient being intubated   Data Reviewed: Basic Metabolic Panel:  Recent Labs Lab 10/26/15 1110 10/26/15 2029 10/27/15 0430  10/28/15 0526 10/28/15 2010 10/29/15 0545 10/29/15 2000 Aug 18, 2015 0456  NA  --   --  145  < > 147* 146* 147* 149* 151*  K  --  2.8* 3.1*  < > 3.5 3.5 3.8 4.0 3.9  CL  --   --  93*  < > 94* 95* 96* 97* 99*  CO2  --   --  43*  < > 43* 42* 46* 45* 43*  GLUCOSE  --   --  105*  < > 142* 127* 122* 121* 130*  BUN  --   --  39*  < > 45* 54* 58* 70* 73*  CREATININE   --   --  1.26*  < > 1.33* 1.31* 1.32* 1.47* 1.39*  CALCIUM  --   --  8.2*  < > 8.3* 8.3* 8.3* 8.5* 8.7*  MG 1.3* 2.0 1.7  --  1.9  --   --   --   --   PHOS 5.3*  --  5.3*  --   --   --   --   --   --   < > = values in this interval not displayed. Liver Function Tests:  Recent Labs Lab 10/26/15 0646 Aug 18, 2015 0456  AST 85* 29  ALT 147* 46  ALKPHOS 56 40  BILITOT 1.4* 0.7  PROT 6.5 6.7  ALBUMIN 2.8* 2.8*   CBC:  Recent Labs Lab 10/25/15 1138 10/26/15 0646 10/27/15 0430 10/28/15 0526 10/29/15 0545 Aug 18, 2015 0456  WBC 11.7* 10.8* 9.0 10.3 17.3* 15.7*  NEUTROABS 7.2*  --   --   --   --   --   HGB 14.7 14.2 14.2 14.0 14.1 13.4  HCT 49.5 47.6 47.2 47.8 48.1 46.5  MCV 70.5* 70.7* 70.6* 72.2* 72.2* 71.8*  PLT 187  153 145* 148* 140* 136*   Cardiac Enzymes:  Recent Labs Lab 10/25/15 1138  TROPONINI <0.03   BNP (last 3 results)  Recent Labs  10/25/15 1138  BNP 934.0*     Studies: Dg Chest 1 View  10/29/2015  CLINICAL DATA:  Shortness of breath. EXAM: CHEST 1 VIEW FINDINGS: Endotracheal tube and left IJ line in stable position. Cardiomegaly with persistent bilateral pulmonary infiltrates suggesting congestive heart failure with persistent pulmonary edema. Bilateral pneumonia cannot be excluded. Small pleural effusions cannot be excluded. No pneumothorax. IMPRESSION: 1. Lines and tubes in stable position. 2. Severe cardiomegaly with persistent bilateral pulmonary alveolar infiltrates, right side greater left. Findings suggest congestive heart failure. Bilateral pneumonia cannot be excluded. Small pleural effusions cannot be excluded. No significant improvement from prior exam. Electronically Signed   By: Maisie Fus  Register   On: 10/29/2015 07:17    Scheduled Meds: . allopurinol  100 mg Per Tube Daily  . antiseptic oral rinse  7 mL Mouth Rinse QID  . aspirin  325 mg Per Tube Daily  . azithromycin  500 mg Intravenous Q24H  . budesonide  0.25 mg Nebulization Q6H  .  cefTAZidime (FORTAZ)  IV  2 g Intravenous 3 times per day  . chlorhexidine gluconate  15 mL Mouth Rinse BID  . famotidine (PEPCID) IV  20 mg Intravenous Q12H  . feeding supplement (PRO-STAT SUGAR FREE 64)  30 mL Oral 6 times per day  . free water  150 mL Per Tube 6 times per day  . ipratropium-albuterol  3 mL Nebulization Q6H  . magnesium oxide  400 mg Per Tube BID  . methylPREDNISolone (SOLU-MEDROL) injection  60 mg Intravenous Q8H  . polyethylene glycol  17 g Oral Daily  . senna-docusate  1 tablet Oral BID  . sodium chloride  3 mL Intravenous Q12H   Continuous Infusions: . feeding supplement (VITAL HIGH PROTEIN) 1,000 mL (11-01-15 0600)  . furosemide (LASIX) infusion 8 mg/hr (11/01/15 0600)  . heparin 2,250 Units/hr (2015-11-01 0600)  . propofol (DIPRIVAN) infusion 50 mcg/kg/min (2015/11/01 0849)    Assessment/Plan:  1. Acute respiratory failure with hypoxia. Patient currently on the ventilator  Currently on a emperic antibiotics. He is also on Lasix drip for acute diastolic CHF. Also on IV Solu-Medrol for bronchospasm. Due to concern for possible PE patient is also on a heparin drip due to his body size unable to do any evaluation for pulmonary embolism. He likely also has morbid obesity hypoventilation syndrome. 2. Acute diastolic congestive heart failure. Continue IV Lasix, his renal function stable continues to put out good amount of urine. 3. Hypomagnesemia and hypokalemia- patient is on electrolyte protocol in the ICU. Magnesium and potassium supplementation underway. Check electrolytes daily 4. Morbid obesity, likely sleep apnea. Weight loss needed 5. Fever: Suspect pulmonary source blood cultures negative to date on empiric Fortaz and azithromycin and continues to be afebrile may need the vancomycin 7. Nutrition-  continue tube feeding  Code Status:     Code Status Orders        Start     Ordered   10/25/15 1805  Full code   Continuous     10/25/15 1804     Family  Communication: Discussed with the mother on the phone  Disposition Plan: Possible transfer to [facility]  Consultants:  Critical care specialist  Procedures:  ET tube  Time spent: 35 minutes critical care time spent Canda Podgorski, [facility]  [facility]

## 2015-10-30 NOTE — Progress Notes (Signed)
ANTICOAGULATION CONSULT NOTE - Initial Consult  Pharmacy Consult for heparin drip Indication: VTE treatment  No Known Allergies  Patient Measurements: Height: 5\' 11"  (180.3 cm) Weight: (!) 567 lb 7.4 oz (257.4 kg) IBW/kg (Calculated) : 75.3 Heparin Dosing Weight: 151kg  Vital Signs: Temp: 101.5 F (38.6 C) (12/03 0900) Temp Source: Axillary (12/03 0900) BP: 115/65 mmHg (12/03 1200) Pulse Rate: 95 (12/03 1200)  Labs:  Recent Labs  10/28/15 0526  10/29/15 0545 10/29/15 2000 11/08/2015 0456 11/19/2015 1214  HGB 14.0  --  14.1  --  13.4  --   HCT 47.8  --  48.1  --  46.5  --   PLT 148*  --  140*  --  136*  --   LABPROT  --   --   --   --  16.6*  --   INR  --   --   --   --  1.33  --   HEPARINUNFRC 0.51  --  0.60  --  0.72* 0.85*  CREATININE 1.33*  < > 1.32* 1.47* 1.39*  --   < > = values in this interval not displayed.  Estimated Creatinine Clearance: 164.3 mL/min (by C-G formula based on Cr of 1.39).   Medical History: Past Medical History  Diagnosis Date  . Morbid obesity (HCC)   . Gout   . History of echocardiogram     a. a. echo 04/15/2015: EF 60-65%, no WMA, LA nl in size, RV poorly visualized, PASP nl, grossly nl study, challening study 2/2 body habitus   . CHF (congestive heart failure) (HCC)     Medications:    Assessment: [name] with morbid obesity and diastolic HF ordered anticoagulation for r/o PE.   Goal of Therapy:  Heparin level 0.3-0.7 units/ml Monitor platelets by anticoagulation protocol: Yes   Plan:  Heparin level is above goal so will decrease heparin infusion to 1950 units/hr and recheck HL in 6 hours.   Luisa HartScott Diana Davenport, PharmD  11/04/2015 1:02 PM

## 2015-10-30 NOTE — Discharge Summary (Signed)
Theodore Reid, 29 y.o., DOB 03-Sep-1986, MRN [id number]. Admission date: 10/25/2015 Discharge Date 11/10/2015 Primary MD Margaretann LovelessJennifer M Burnette, PA-C Admitting Physician Adrian SaranSital Mody, MD  Admission Diagnosis  Hypoxia [R09.02] Congestive heart failure, unspecified congestive heart failure chronicity, unspecified congestive heart failure type [facility](HCC) [I50.9]  Discharge Diagnosis   Active Problems:   Acute on chronic diastolic CHF (congestive heart failure) (HCC)  Acute Respiratory failure (HCC)   Bilateral leg edema   Dyspnea  Morbid obesity   Fever Suspected pneumonia Possible ARDS      Hospital Course  Theodore Reid is a 29 y.o. male with a known history of morbid obesity, diastolic heart failure and gout who [facility] acute on chronic diastolic heart failure. Patient was seen today for a follow-up visit. Patient has had increasing shortness of breath, dyspnea exertion and weight gain over the past 2 weeks. Patient in the emergency room at to be placed on a BiPAP. And was noted to have acute CHF. He was admitted with CHF and his respiratory status continued to deteriorate and required intubation. It is felt that his respiratory failure is due to multifactorial reasons including acute diastolic CHF, he is also empirically being treated for pulmonary embolism with IV heparin. Patient also has been having fevers and likely has pneumonia. Is also on IV antibiotics with Fortaz and azithromycin. Patient at very high risk for complications and may need trach. Intensivist has discussed the case with the intensivist at [facility] and patient is being currently transferred. Mother in agreement with the transfer for continued care.           Consults  pulmonary/intensive care, cardiology  Significant Tests:  See full reports for all details    Dg Chest 1 View  10/29/2015  CLINICAL DATA:  Shortness of breath. EXAM: CHEST 1 VIEW FINDINGS: Endotracheal tube and left  IJ line in stable position. Cardiomegaly with persistent bilateral pulmonary infiltrates suggesting congestive heart failure with persistent pulmonary edema. Bilateral pneumonia cannot be excluded. Small pleural effusions cannot be excluded. No pneumothorax. IMPRESSION: 1. Lines and tubes in stable position. 2. Severe cardiomegaly with persistent bilateral pulmonary alveolar infiltrates, right side greater left. Findings suggest congestive heart failure. Bilateral pneumonia cannot be excluded. Small pleural effusions cannot be excluded. No significant improvement from prior exam. Electronically Signed   By: Maisie Fushomas  Register   On: 10/29/2015 07:17   Dg Chest 1 View  10/28/2015  CLINICAL DATA:  CHF, respiratory failure, ARDS. EXAM: CHEST 1 VIEW COMPARISON:  Portable chest x-ray of October 27, 2015 FINDINGS: The lungs are adequately inflated. The interstitial markings are increased in the mid and lower lung zones and there is confluent alveolar opacity at both bases greater on the right than on the left. The hemidiaphragms remain partially obscured but are better demonstrated today. The cardiac silhouette remains enlarged. The pulmonary vascularity remains engorged. An esophagogastric tube is visible. The endotracheal tube if present appears to be positioned somewhat superiorly lie at least 2 cm above the superior margin of the clavicular heads and 12 cm above the carina. The esophagogastric tube cannot be followed beyond the mid thorax. IMPRESSION: Slight interval improvement in bilateral alveolar opacities may reflect resolving pulmonary edema or pneumonia. Stable cardiomegaly and central pulmonary vascular congestion. High positioning of the endotracheal tube 12 cm above the carina. Advancement by 4-5 cm is recommended. Electronically Signed   By: David  Swaziland[place] M.D.   On: 10/28/2015 07:24   Dg Chest 1 View  10/27/2015  CLINICAL DATA:  29 year old male with shortness of Breath. Respiratory failure. Initial  encounter. EXAM: CHEST 1 VIEW COMPARISON:  10/26/2015 and earlier. FINDINGS: Portable AP semi upright view at 0612 hours. Endotracheal tube tip projects just above the clavicles. Enteric tube cannot be visualized below the level of the carina due to large body habitus. Left IJ central line in place, tip also difficult to visualize. Bilateral veiling pulmonary opacity greater on the right. Obscured diaphragm. Dense retrocardiac opacity. No pneumothorax. Pulmonary vascularity mildly improved since yesterday. IMPRESSION: 1. Endotracheal tube tip just above the clavicles. Enteric tube and left IJ central line tip poorly visible, primarily due to large body habitus. 2. Bilateral pleural effusions with dense bibasilar consolidation or atelectasis. 3. Decreased pulmonary vascular congestion since yesterday. Electronically Signed   By: Odessa Fleming M.D.   On: 10/27/2015 07:21   Dg Chest 1 View  10/26/2015  CLINICAL DATA:  29 year old male status post NG tube placement. EXAM: CHEST 1 VIEW COMPARISON:  Chest x-ray 10/26/2015. FINDINGS: Film is severely underpenetrated secondary to the patient's large body habitus. This limits the diagnostic sensitivity and specificity of the examination. The patient is intubated, with the tip of the endotracheal tube approximately 5 cm above the carina. A nasogastric tube is also visualized which appears to extend toward the abdomen, but the distal aspect of the tube is obscured by overlying soft tissue opacity. Lung volumes are very low. Extensive bibasilar opacities may reflect areas of atelectasis and/or consolidation throughout the mid to lower lungs bilaterally. Probable moderate right and small left pleural effusion. Distended pulmonary vasculature. Indistinct interstitial markings. Cardiomegaly. Upper mediastinal contours are grossly distorted. IMPRESSION: 1. Support apparatus, as above. 2. Low lung volumes with findings suggestive of congestive heart failure, as above. Electronically  Signed   By: Trudie Reed M.D.   On: 10/26/2015 13:24   Dg Chest 1 View  10/25/2015  CLINICAL DATA:  Tube placements EXAM: CHEST 1 VIEW COMPARISON:  10/25/2015 at 11:09 FINDINGS: Endotracheal tube tip is 6.4 cm above the carina. Left jugular central line reaches the expected location of the SVC, probably with its tip just below the azygos vein junction. Orogastric tube is not conclusively visible. No pneumothorax is evident. Basilar opacities persist bilaterally. Significant study limitations due to patient body habitus and portable technique. IMPRESSION: ET tube and left jugular central line appear satisfactorily positioned. OG tube not visible. Limited study. Electronically Signed   By: Ellery Plunk M.D.   On: 10/25/2015 22:27   Dg Chest 1 View  10/25/2015  CLINICAL DATA:  Pulmonary edema. EXAM: CHEST 1 VIEW COMPARISON:  04/16/2015 FINDINGS: Technically limited study due to morbid obesity and portable technique. Cardiac enlargement with vascular congestion. No definite edema or effusion. Bibasilar atelectasis. IMPRESSION: Limited study. Probable mild fluid overload. Two-view chest x-ray suggested for better quality images. Electronically Signed   By: Marlan Palau M.D.   On: 10/25/2015 11:45   Dg Abd 1 View  10/25/2015  CLINICAL DATA:  Orogastric tube placement EXAM: ABDOMEN - 1 VIEW COMPARISON:  None. FINDINGS: No orogastric tube is identified. Due to poor radiographic technique, the bowel gas cannot be assessed. IMPRESSION: No orogastric tube identified. Electronically Signed   By: Elige Ko   On: 10/25/2015 22:22   US Venous Img Lower Bilateral  10/26/2015  CLINICAL DATA:  29 year old male with a bilateral lower extremity edema EXAM: BILATERAL LOWER EXTREMITY VENOUS DOPPLER ULTRASOUND TECHNIQUE: Gray-scale sonography with graded compression, as well as color Doppler and duplex ultrasound were performed  to evaluate the lower extremity deep venous systems from the level of the common  femoral vein and including the common femoral, femoral, profunda femoral, popliteal and calf veins including the posterior tibial, peroneal and gastrocnemius veins when visible. The superficial great saphenous vein was also interrogated. Spectral Doppler was utilized to evaluate flow at rest and with distal augmentation maneuvers in the common femoral, femoral and popliteal veins. COMPARISON:  Prior duplex venous ultrasound 04/16/2015 FINDINGS: RIGHT LOWER EXTREMITY Common Femoral Vein: No evidence of thrombus. Normal compressibility, respiratory phasicity and response to augmentation. Saphenofemoral Junction: No evidence of thrombus. Normal compressibility and flow on color Doppler imaging. Profunda Femoral Vein: No evidence of thrombus. Normal compressibility and flow on color Doppler imaging. Femoral Vein: No evidence of thrombus. Normal compressibility, respiratory phasicity and response to augmentation. Popliteal Vein: No evidence of thrombus. Normal compressibility, respiratory phasicity and response to augmentation. Calf Veins: No evidence of thrombus. Normal compressibility and flow on color Doppler imaging. Superficial Great Saphenous Vein: No evidence of thrombus. Normal compressibility and flow on color Doppler imaging. Venous Reflux:  None. Other Findings:  None. LEFT LOWER EXTREMITY Common Femoral Vein: No evidence of thrombus. Normal compressibility, respiratory phasicity and response to augmentation. Saphenofemoral Junction: No evidence of thrombus. Normal compressibility and flow on color Doppler imaging. Profunda Femoral Vein: No evidence of thrombus. Normal compressibility and flow on color Doppler imaging. Femoral Vein: No evidence of thrombus. Normal compressibility, respiratory phasicity and response to augmentation. Popliteal Vein: No evidence of thrombus. Normal compressibility, respiratory phasicity and response to augmentation. Calf Veins: No evidence of thrombus. Normal compressibility and  flow on color Doppler imaging. Superficial Great Saphenous Vein: No evidence of thrombus. Normal compressibility and flow on color Doppler imaging. Venous Reflux:  None. Other Findings:  None. IMPRESSION: No evidence of deep venous thrombosis. Electronically Signed   By: Malachy Moan M.D.   On: 10/26/2015 15:11   Dg Chest Port 1 View  10/26/2015  CLINICAL DATA:  Respiratory failure EXAM: PORTABLE CHEST 1 VIEW COMPARISON:  10/17/2015 FINDINGS: Cardiomegaly again noted. Study is markedly limited by patient's large body habitus and poor inspiration. Persistent central vascular congestion mild interstitial prominence bilateral suspicious for pulmonary edema. Bilateral basilar atelectasis or infiltrate again noted. Stable endotracheal tube position. IMPRESSION: Study is markedly limited by patient's large body habitus and poor inspiration. Persistent central vascular congestion mild interstitial prominence bilateral suspicious for pulmonary edema. Bilateral basilar atelectasis or infiltrate again noted. Stable endotracheal tube position. Electronically Signed   By: Natasha Mead M.D.   On: 10/26/2015 08:04          CBC w Diff: Lab Results  Component Value Date   WBC 15.7* November 29, 2015   HGB 13.4 29-Nov-2015   HCT 46.5 Nov 29, 2015   PLT 136* 11/29/2015   LYMPHOPCT 26% 10/25/2015   MONOPCT 12% 10/25/2015   EOSPCT 0% 10/25/2015   BASOPCT 0% 10/25/2015   CMP: Lab Results  Component Value Date   NA 151* 29-Nov-2015   K 3.9 11-29-15   CL 99* 2015/11/29   CO2 43* Nov 29, 2015   BUN 73* Nov 29, 2015   CREATININE 1.39* 29-Nov-2015   PROT 6.7 11/29/2015   ALBUMIN 2.8* November 29, 2015   BILITOT 0.7 11/29/2015   ALKPHOS 40 November 29, 2015   AST 29 November 29, 2015   ALT 46 11/29/2015  .  Micro Results Recent Results (from the past 240 hour(s))  Culture, respiratory (NON-Expectorated)     Status: None   Collection Time: 10/26/15 12:05 AM  Result Value Ref Range Status  Specimen Description TRACHEAL ASPIRATE   Final   Special Requests NONE  Final   Gram Stain   Final    FAIR SPECIMEN - 70-80% WBCS FEW WBC SEEN MODERATE GRAM POSITIVE COCCI IN PAIRS IN CHAINS    Culture Consistent with normal respiratory flora.  Final   Report Status 10/29/2015 FINAL  Final  MRSA PCR Screening     Status: None   Collection Time: 10/28/15  3:17 PM  Result Value Ref Range Status   MRSA by PCR NEGATIVE NEGATIVE Final    Comment:        The GeneXpert MRSA Assay (FDA approved for NASAL specimens only), is one component of a comprehensive MRSA colonization surveillance program. It is not intended to diagnose MRSA infection nor to guide or monitor treatment for MRSA infections.   Culture, blood (routine x 2)     Status: None (Preliminary result)   Collection Time: 10/29/15 10:15 PM  Result Value Ref Range Status   Specimen Description BLOOD RIGHT HAND  Final   Special Requests   Final    BOTTLES DRAWN AEROBIC AND ANAEROBIC 6CC AERO 5CC ANA   Culture NO GROWTH < 12 HOURS  Final   Report Status PENDING  Incomplete  Culture, blood (routine x 2)     Status: None (Preliminary result)   Collection Time: 10/29/15 10:15 PM  Result Value Ref Range Status   Specimen Description BLOOD LEFT HAND  Final   Special Requests BOTTLES DRAWN AEROBIC AND ANAEROBIC 6CC  Final   Culture NO GROWTH < 12 HOURS  Final   Report Status PENDING  Incomplete        Code Status Orders        Start     Ordered   10/25/15 1805  Full code   Continuous     10/25/15 1804            Discharge Medications     Medication List    ASK your doctor about these medications        acetaminophen 325 MG tablet  Commonly known as:  TYLENOL  Take 650 mg by mouth every 6 (six) hours as needed for headache.     albuterol 108 (90 BASE) MCG/ACT inhaler  Commonly known as:  PROVENTIL HFA;VENTOLIN HFA  Inhale 2 puffs into the lungs every 4 (four) hours as needed for wheezing or shortness of breath.     allopurinol 100 MG tablet   Commonly known as:  ZYLOPRIM  Take 1 tablet (100 mg total) by mouth daily.     aspirin EC 81 MG tablet  Take 1 tablet (81 mg total) by mouth daily.     furosemide 20 MG tablet  Commonly known as:  LASIX  Take 1 tablet (20 mg total) by mouth 2 (two) times daily.           Critical care time Total Time in preparing paper work, data evaluation and todays exam - 35 minutes in addition to follow-up done earlier  Auburn Bilberry M.D on 11/11/2015 at 9:44 AM  [facility]   Office  [phone]

## 2015-10-30 NOTE — Progress Notes (Signed)
Patient: Theodore Reid / Admit Date: 10/25/2015 / Date of Encounter: 2015-11-13, 11:40 AM   Subjective: No acute events.   Review of Systems: Review of Systems  Unable to perform ROS: intubated    Objective: Telemetry: NSR, 90's Physical Exam: Blood pressure 110/59, pulse 98, temperature 101.5 F (38.6 C), temperature source Axillary, resp. rate 18, height  (1.803 m), weight 567 lb 7.4 oz (257.4 kg), SpO2 99 %. Body mass index is 79.18 kg/(m^2). General: Well developed, well nourished, intubated and sedated. Head: Normocephalic, atraumatic, sclera non-icteric, no xanthomas, nares are without discharge. Neck: Negative for carotid bruits. JVP not elevated. Lungs: Decreased bilaterally. Heart: RRR S1 S2 without murmurs, rubs, or gallops.  Abdomen: Obese, soft, non-tender, non-distended with normoactive bowel sounds. No rebound/guarding. Extremities: No clubbing or cyanosis. 1+ pitting edema to the bilateral thighs. Distal pedal pulses are 2+ and equal bilaterally. Neuro: Intubated and sedated. Psych:  Intubated and sedated.   Intake/Output Summary (Last 24 hours) at 11/13/15 1140 Last data filed at 11-13-2015 1000  Gross per 24 hour  Intake 3623.95 ml  Output   4158 ml  Net -534.05 ml    Inpatient Medications:  . allopurinol  100 mg Per Tube Daily  . antiseptic oral rinse  7 mL Mouth Rinse QID  . aspirin  325 mg Per Tube Daily  . azithromycin  500 mg Intravenous Q24H  . budesonide  0.25 mg Nebulization Q6H  . cefTAZidime (FORTAZ)  IV  2 g Intravenous 3 times per day  . chlorhexidine gluconate  15 mL Mouth Rinse BID  . famotidine (PEPCID) IV  20 mg Intravenous Q12H  . feeding supplement (PRO-STAT SUGAR FREE 64)  30 mL Oral 6 times per day  . free water  175 mL Per Tube 6 times per day  . ipratropium-albuterol  3 mL Nebulization Q6H  . magnesium oxide  400 mg Per Tube BID  . methylPREDNISolone (SOLU-MEDROL) injection  60 mg Intravenous Q8H  . polyethylene glycol   17 g Oral Daily  . senna-docusate  1 tablet Oral BID  . sodium chloride  3 mL Intravenous Q12H   Infusions:  . feeding supplement (VITAL HIGH PROTEIN) 1,000 mL (2015-11-13 1000)  . furosemide (LASIX) infusion 8 mg/hr (2015-11-13 1000)  . heparin 2,250 Units/hr (November 13, 2015 1000)  . propofol (DIPRIVAN) infusion 50 mcg/kg/min (11-13-2015 1000)    Labs:  Recent Labs  10/28/15 0526  10/29/15 2000 11/13/2015 0456  NA 147*  < > 149* 151*  K 3.5  < > 4.0 3.9  CL 94*  < > 97* 99*  CO2 43*  < > 45* 43*  GLUCOSE 142*  < > 121* 130*  BUN 45*  < > 70* 73*  CREATININE 1.33*  < > 1.47* 1.39*  CALCIUM 8.3*  < > 8.5* 8.7*  MG 1.9  --   --   --   < > = values in this interval not displayed.  Recent Labs  11-13-2015 0456  AST 29  ALT 46  ALKPHOS 40  BILITOT 0.7  PROT 6.7  ALBUMIN 2.8*    Recent Labs  10/29/15 0545 11/13/15 0456  WBC 17.3* 15.7*  HGB 14.1 13.4  HCT 48.1 46.5  MCV 72.2* 71.8*  PLT 140* 136*   No results for input(s): CKTOTAL, CKMB, TROPONINI in the last 72 hours. Invalid input(s): POCBNP  Recent Labs  10/29/15 0545  HGBA1C 6.4*     Weights: Filed Weights   10/28/15 0700 10/29/15 1610 November 13, 2015  0200  Weight: 580 lb 0.5 oz (263.1 kg) 567 lb 3.9 oz (257.3 kg) 567 lb 7.4 oz (257.4 kg)     Radiology/Studies:  Dg Chest 1 View  10/29/2015  CLINICAL DATA:  Shortness of breath. EXAM: CHEST 1 VIEW FINDINGS: Endotracheal tube and left IJ line in stable position. Cardiomegaly with persistent bilateral pulmonary infiltrates suggesting congestive heart failure with persistent pulmonary edema. Bilateral pneumonia cannot be excluded. Small pleural effusions cannot be excluded. No pneumothorax. IMPRESSION: 1. Lines and tubes in stable position. 2. Severe cardiomegaly with persistent bilateral pulmonary alveolar infiltrates, right side greater left. Findings suggest congestive heart failure. Bilateral pneumonia cannot be excluded. Small pleural effusions cannot be excluded. No  significant improvement from prior exam. Electronically Signed   By: Maisie Fus  Register   On: 10/29/2015 07:17   Dg Chest 1 View  10/28/2015  CLINICAL DATA:  CHF, respiratory failure, ARDS. EXAM: CHEST 1 VIEW COMPARISON:  Portable chest x-ray of October 27, 2015 FINDINGS: The lungs are adequately inflated. The interstitial markings are increased in the mid and lower lung zones and there is confluent alveolar opacity at both bases greater on the right than on the left. The hemidiaphragms remain partially obscured but are better demonstrated today. The cardiac silhouette remains enlarged. The pulmonary vascularity remains engorged. An esophagogastric tube is visible. The endotracheal tube if present appears to be positioned somewhat superiorly lie at least 2 cm above the superior margin of the clavicular heads and 12 cm above the carina. The esophagogastric tube cannot be followed beyond the mid thorax. IMPRESSION: Slight interval improvement in bilateral alveolar opacities may reflect resolving pulmonary edema or pneumonia. Stable cardiomegaly and central pulmonary vascular congestion. High positioning of the endotracheal tube 12 cm above the carina. Advancement by 4-5 cm is recommended. Electronically Signed   By: David  [place] M.D.   On: 10/28/2015 07:24   Dg Chest 1 View  10/27/2015  CLINICAL DATA:  29 year old male with shortness of Breath. Respiratory failure. Initial encounter. EXAM: CHEST 1 VIEW COMPARISON:  10/26/2015 and earlier. FINDINGS: Portable AP semi upright view at 0612 hours. Endotracheal tube tip projects just above the clavicles. Enteric tube cannot be visualized below the level of the carina due to large body habitus. Left IJ central line in place, tip also difficult to visualize. Bilateral veiling pulmonary opacity greater on the right. Obscured diaphragm. Dense retrocardiac opacity. No pneumothorax. Pulmonary vascularity mildly improved since yesterday. IMPRESSION: 1. Endotracheal tube tip  just above the clavicles. Enteric tube and left IJ central line tip poorly visible, primarily due to large body habitus. 2. Bilateral pleural effusions with dense bibasilar consolidation or atelectasis. 3. Decreased pulmonary vascular congestion since yesterday. Electronically Signed   By: Odessa Fleming M.D.   On: 10/27/2015 07:21   Dg Chest 1 View  10/26/2015  CLINICAL DATA:  29 year old male status post NG tube placement. EXAM: CHEST 1 VIEW COMPARISON:  Chest x-ray 10/26/2015. FINDINGS: Film is severely underpenetrated secondary to the patient's large body habitus. This limits the diagnostic sensitivity and specificity of the examination. The patient is intubated, with the tip of the endotracheal tube approximately 5 cm above the carina. A nasogastric tube is also visualized which appears to extend toward the abdomen, but the distal aspect of the tube is obscured by overlying soft tissue opacity. Lung volumes are very low. Extensive bibasilar opacities may reflect areas of atelectasis and/or consolidation throughout the mid to lower lungs bilaterally. Probable moderate right and small left pleural effusion. Distended pulmonary vasculature.  Indistinct interstitial markings. Cardiomegaly. Upper mediastinal contours are grossly distorted. IMPRESSION: 1. Support apparatus, as above. 2. Low lung volumes with findings suggestive of congestive heart failure, as above. Electronically Signed   By: Trudie Reed M.D.   On: 10/26/2015 13:24   Dg Chest 1 View  10/25/2015  CLINICAL DATA:  Tube placements EXAM: CHEST 1 VIEW COMPARISON:  10/25/2015 at 11:09 FINDINGS: Endotracheal tube tip is 6.4 cm above the carina. Left jugular central line reaches the expected location of the SVC, probably with its tip just below the azygos vein junction. Orogastric tube is not conclusively visible. No pneumothorax is evident. Basilar opacities persist bilaterally. Significant study limitations due to patient body habitus and portable  technique. IMPRESSION: ET tube and left jugular central line appear satisfactorily positioned. OG tube not visible. Limited study. Electronically Signed   By: Ellery Plunk M.D.   On: 10/25/2015 22:27   Dg Chest 1 View  10/25/2015  CLINICAL DATA:  Pulmonary edema. EXAM: CHEST 1 VIEW COMPARISON:  04/16/2015 FINDINGS: Technically limited study due to morbid obesity and portable technique. Cardiac enlargement with vascular congestion. No definite edema or effusion. Bibasilar atelectasis. IMPRESSION: Limited study. Probable mild fluid overload. Two-view chest x-ray suggested for better quality images. Electronically Signed   By: Marlan Palau M.D.   On: 10/25/2015 11:45   Dg Abd 1 View  10/25/2015  CLINICAL DATA:  Orogastric tube placement EXAM: ABDOMEN - 1 VIEW COMPARISON:  None. FINDINGS: No orogastric tube is identified. Due to poor radiographic technique, the bowel gas cannot be assessed. IMPRESSION: No orogastric tube identified. Electronically Signed   By: Elige Ko   On: 10/25/2015 22:22   US Venous Img Lower Bilateral  10/26/2015  CLINICAL DATA:  29 year old male with a bilateral lower extremity edema EXAM: BILATERAL LOWER EXTREMITY VENOUS DOPPLER ULTRASOUND TECHNIQUE: Gray-scale sonography with graded compression, as well as color Doppler and duplex ultrasound were performed to evaluate the lower extremity deep venous systems from the level of the common femoral vein and including the common femoral, femoral, profunda femoral, popliteal and calf veins including the posterior tibial, peroneal and gastrocnemius veins when visible. The superficial great saphenous vein was also interrogated. Spectral Doppler was utilized to evaluate flow at rest and with distal augmentation maneuvers in the common femoral, femoral and popliteal veins. COMPARISON:  Prior duplex venous ultrasound 04/16/2015 FINDINGS: RIGHT LOWER EXTREMITY Common Femoral Vein: No evidence of thrombus. Normal compressibility,  respiratory phasicity and response to augmentation. Saphenofemoral Junction: No evidence of thrombus. Normal compressibility and flow on color Doppler imaging. Profunda Femoral Vein: No evidence of thrombus. Normal compressibility and flow on color Doppler imaging. Femoral Vein: No evidence of thrombus. Normal compressibility, respiratory phasicity and response to augmentation. Popliteal Vein: No evidence of thrombus. Normal compressibility, respiratory phasicity and response to augmentation. Calf Veins: No evidence of thrombus. Normal compressibility and flow on color Doppler imaging. Superficial Great Saphenous Vein: No evidence of thrombus. Normal compressibility and flow on color Doppler imaging. Venous Reflux:  None. Other Findings:  None. LEFT LOWER EXTREMITY Common Femoral Vein: No evidence of thrombus. Normal compressibility, respiratory phasicity and response to augmentation. Saphenofemoral Junction: No evidence of thrombus. Normal compressibility and flow on color Doppler imaging. Profunda Femoral Vein: No evidence of thrombus. Normal compressibility and flow on color Doppler imaging. Femoral Vein: No evidence of thrombus. Normal compressibility, respiratory phasicity and response to augmentation. Popliteal Vein: No evidence of thrombus. Normal compressibility, respiratory phasicity and response to augmentation. Calf Veins: No evidence of thrombus.  Normal compressibility and flow on color Doppler imaging. Superficial Great Saphenous Vein: No evidence of thrombus. Normal compressibility and flow on color Doppler imaging. Venous Reflux:  None. Other Findings:  None. IMPRESSION: No evidence of deep venous thrombosis. Electronically Signed   By: Malachy MoanHeath  McCullough M.D.   On: 10/26/2015 15:11   Dg Chest Port 1 View  10/26/2015  CLINICAL DATA:  Respiratory failure EXAM: PORTABLE CHEST 1 VIEW COMPARISON:  10/17/2015 FINDINGS: Cardiomegaly again noted. Study is markedly limited by patient's large body habitus  and poor inspiration. Persistent central vascular congestion mild interstitial prominence bilateral suspicious for pulmonary edema. Bilateral basilar atelectasis or infiltrate again noted. Stable endotracheal tube position. IMPRESSION: Study is markedly limited by patient's large body habitus and poor inspiration. Persistent central vascular congestion mild interstitial prominence bilateral suspicious for pulmonary edema. Bilateral basilar atelectasis or infiltrate again noted. Stable endotracheal tube position. Electronically Signed   By: Natasha MeadLiviu  Pop M.D.   On: 10/26/2015 08:04     Assessment and Plan   1. Acute hypoxic and hypercarbic respiratory failure: -Likely multifactorial in the setting of acute on chronic diastolic CHF and morbid obesity hypoventilation syndrome,  -On 100% FiO2  -Weight down to 567 lbs (from 620 ?) not sure if accurate.  -He he is currently on Lasix drip at 8 mg per hour. Due to his morbid obesity, it's difficult to accurately estimate his volume status. Continue to monitor renal function closely as at some point we have to stop diuresis which remains stable at this time.  -Minus 2950 mL for past 24 hours -Unfortunately, he continues to be on maximal vent support. -Overall prognosis seem very poor.  2. Acute on chronic diastolic CHF: -Would continue aggressive diuresis  3. Morbid obesity hypoventilation syndrome -Pulmonary following. Will probably require longterm vent/trach.   4. Dispo: -[facility]  Signed, Eula Listenyan Sharnese Heath, PA-C Pager: [phone] 11/04/2015, 11:40 AM

## 2015-10-30 NOTE — Progress Notes (Signed)
Initial Nutrition Assessment   INTERVENTION:   EN: Continue current tube feeding regimen. Discussed hypernatremia with MD Mungal this am on rounds. Will increase free water to q4 hours secondary to hypernatremia (total of daily from tube feeding and all flushes) per MD Mungal. Will continue to monitor and make recommendations accordingly.   NUTRITION DIAGNOSIS:   Inadequate oral intake related to acute illness as evidenced by NPO status.  GOAL:   Provide needs based on ASPEN/SCCM guidelines  MONITOR:    (Energy Intake, Pulmonary, Digestive System, Electrolyte/Renal Profile, Glucose Profile)  REASON FOR ASSESSMENT:   Ventilator, Consult Enteral/tube feeding initiation and management  ASSESSMENT:    Pt with plan to transfer to [facility] when able. Pt continues on IV Lasix and full vent support. Per MD note pt likely needing trach placement.  EN: pt tolerating Vital High Protein at 27mL/hr with free water of q 4 hours and Prostat BID (RD notes propofol providing roughly 1124kcals in last 24 hours)  Gastrointestinal Profile: Last BM: 10/25/2015   Scheduled Medications:  . allopurinol  100 mg Per Tube Daily  . antiseptic oral rinse  7 mL Mouth Rinse QID  . aspirin  325 mg Per Tube Daily  . azithromycin  500 mg Intravenous Q24H  . budesonide  0.25 mg Nebulization Q6H  . cefTAZidime (FORTAZ)  IV  2 g Intravenous 3 times per day  . chlorhexidine gluconate  15 mL Mouth Rinse BID  . famotidine (PEPCID) IV  20 mg Intravenous Q12H  . feeding supplement (PRO-STAT SUGAR FREE 64)  30 mL Oral 6 times per day  . free water  175 mL Per Tube 6 times per day  . ipratropium-albuterol  3 mL Nebulization Q6H  . magnesium oxide  400 mg Per Tube BID  . methylPREDNISolone (SOLU-MEDROL) injection  60 mg Intravenous Q8H  . polyethylene glycol  17 g Oral Daily  . senna-docusate  1 tablet Oral BID  . sodium chloride  3 mL Intravenous Q12H    Continuous Medications:  .  feeding supplement (VITAL HIGH PROTEIN) 1,000 mL (2015-11-27 1200)  . furosemide (LASIX) infusion 8 mg/hr (11/27/2015 1200)  . heparin 2,250 Units/hr (11-27-15 1200)  . propofol (DIPRIVAN) infusion 50 mcg/kg/min (11-27-2015 1200)     Electrolyte/Renal Profile and Glucose Profile:   Recent Labs Lab 10/26/15 1110 10/26/15 2029 10/27/15 0430  10/28/15 0526  10/29/15 0545 10/29/15 2000 2015/11/27 0456  NA  --   --  145  < > 147*  < > 147* 149* 151*  K  --  2.8* 3.1*  < > 3.5  < > 3.8 4.0 3.9  CL  --   --  93*  < > 94*  < > 96* 97* 99*  CO2  --   --  43*  < > 43*  < > 46* 45* 43*  BUN  --   --  39*  < > 45*  < > 58* 70* 73*  CREATININE  --   --  1.26*  < > 1.33*  < > 1.32* 1.47* 1.39*  CALCIUM  --   --  8.2*  < > 8.3*  < > 8.3* 8.5* 8.7*  MG 1.3* 2.0 1.7  --  1.9  --   --   --   --   PHOS 5.3*  --  5.3*  --   --   --   --   --   --   GLUCOSE  --   --  105*  < > 142*  < > 122* 121* 130*  < > = values in this interval not displayed. Protein Profile:  Recent Labs Lab 10/26/15 0646 11/22/2015 0456  ALBUMIN 2.8* 2.8*     Weight Trend since Admission: Filed Weights   10/28/15 0700 10/29/15 0338 11/11/2015 0200  Weight: 580 lb 0.5 oz (263.1 kg) 567 lb 3.9 oz (257.3 kg) 567 lb 7.4 oz (257.4 kg)     Skin:  Reviewed, no issues   Ideal Body Weight:   78kg  BMI:  Body mass index is 79.18 kg/(m^2).  Estimated Nutritional Needs:   Kcal:  1716-1950 kcals (22-25 kcals/kg) using IBW 78 kg  Protein:  156-195 g (2.0-2.5 g/kg)   Fluid:  1950-2340 mL (25-30 ml/kg)     HIGH Care Level  Leda QuailAllyson Dyanara Cozza, RD, LDN Pager [phone]

## 2015-10-30 NOTE — Progress Notes (Signed)
ANTICOAGULATION CONSULT NOTE - Initial Consult  Pharmacy Consult for heparin drip Indication: VTE treatment  No Known Allergies  Patient Measurements: Height: 5\' 11"  (180.3 cm) Weight: (!) 567 lb 7.4 oz (257.4 kg) IBW/kg (Calculated) : 75.3 Heparin Dosing Weight: 151kg  Vital Signs: Temp: 102.2 F (39 C) (12/03 2000) Temp Source: Axillary (12/03 2000) BP: 120/56 mmHg (12/03 2000) Pulse Rate: 104 (12/03 2000)  Labs:  Recent Labs  10/28/15 0526  10/29/15 0545 10/29/15 2000 11/18/2015 0456 11/17/2015 1214 11/15/2015 1918  HGB 14.0  --  14.1  --  13.4  --   --   HCT 47.8  --  48.1  --  46.5  --   --   PLT 148*  --  140*  --  136*  --   --   LABPROT  --   --   --   --  16.6*  --   --   INR  --   --   --   --  1.33  --   --   HEPARINUNFRC 0.51  --  0.60  --  0.72* 0.85* 0.80*  CREATININE 1.33*  < > 1.32* 1.47* 1.39*  --   --   < > = values in this interval not displayed.  Estimated Creatinine Clearance: 164.3 mL/min (by C-G formula based on Cr of 1.39).   Medical History: Past Medical History  Diagnosis Date  . Morbid obesity (HCC)   . Gout   . History of echocardiogram     a. a. echo 04/15/2015: EF 60-65%, no WMA, LA nl in size, RV poorly visualized, PASP nl, grossly nl study, challening study 2/2 body habitus   . CHF (congestive heart failure) (HCC)     Medications:    Assessment: [name] with morbid obesity and diastolic HF ordered anticoagulation for r/o PE.   Goal of Therapy:  Heparin level 0.3-0.7 units/ml Monitor platelets by anticoagulation protocol: Yes   Plan:  Heparin level is above goal so will decrease heparin infusion to 1800 units/hr and recheck HL in 6 hours.   Olene FlossMelissa D Roselynne Lortz, Pharm.D Clinical Pharmacist   11/02/2015 8:14 PM

## 2015-10-30 NOTE — Progress Notes (Signed)
ANTICOAGULATION CONSULT NOTE - Initial Consult  Pharmacy Consult for heparin drip Indication: VTE treatment  No Known Allergies  Patient Measurements: Height: 5\' 11"  (180.3 cm) Weight: (!) 567 lb 7.4 oz (257.4 kg) IBW/kg (Calculated) : 75.3 Heparin Dosing Weight: 151kg  Vital Signs: Temp: 102.4 F (39.1 C) (12/03 0400) Temp Source: Oral (12/03 0400) BP: 103/52 mmHg (12/03 0500) Pulse Rate: 100 (12/03 0500)  Labs:  Recent Labs  10/28/15 0526 10/28/15 2010 10/29/15 0545 10/29/15 2000 11/19/2015 0456  HGB 14.0  --  14.1  --  13.4  HCT 47.8  --  48.1  --  46.5  PLT 148*  --  140*  --  136*  HEPARINUNFRC 0.51  --  0.60  --  0.72*  CREATININE 1.33* 1.31* 1.32* 1.47*  --     Estimated Creatinine Clearance: 155.3 mL/min (by C-G formula based on Cr of 1.47).   Medical History: Past Medical History  Diagnosis Date  . Morbid obesity (HCC)   . Gout   . History of echocardiogram     a. a. echo 04/15/2015: EF 60-65%, no WMA, LA nl in size, RV poorly visualized, PASP nl, grossly nl study, challening study 2/2 body habitus   . CHF (congestive heart failure) (HCC)     Medications:    Assessment: [name] with morbid obesity and diastolic HF ordered anticoagulation for r/o PE.   Goal of Therapy:  Heparin level 0.3-0.7 units/ml Monitor platelets by anticoagulation protocol: Yes   Plan:  Heparin level is at goal at 0.49 so will continue heparin infusion at 2400 units/hr. Will check a HL and CBC with am labs.   11/30 AM heparin level 0.39. Continue current regimen. Recheck heparin level and CBC in AM.  12/01 AM heparin level 0.51. Continue current regimen. Recheck heparin level and CBC in AM.  12/02  AM heparin level 0.60. Continue current regimen. Recheck heparin level and CBC in AM.  12/03 AM heparin level 0.72. Decrease to 2250 units/hr and recheck in 6 hours.  Fulton ReekMatt Akoni Parton, PharmD, BCPS 10/30/2015 5:49 AM

## 2015-10-30 NOTE — Progress Notes (Signed)
Pharmacy Consult for Electrolyte Management  No Known Allergies  Patient Measurements: Height: 5\' 11"  (180.3 cm) Weight: (!) 567 lb 7.4 oz (257.4 kg) IBW/kg (Calculated) : 75.3   Vital Signs: Temp: 102.4 F (39.1 C) (12/03 0400) Temp Source: Oral (12/03 0400) BP: 103/53 mmHg (12/03 0600) Pulse Rate: 91 (12/03 0600) Intake/Output from previous day: 12/02 0701 - 12/03 0700 In: 3330.5 [I.V.:1873; NG/GT:957.5; IV Piggyback:500] Out: 2962 [Urine:2950; Emesis/NG output:12] Intake/Output from this shift: Total I/O In: 1601.5 [I.V.:1031.5; NG/GT:420; IV Piggyback:150] Out: 1800 [Urine:1800]  Labs:  Recent Labs  10/28/15 0526 10/29/15 0545 2015-06-21 0456  WBC 10.3 17.3* 15.7*  HGB 14.0 14.1 13.4  HCT 47.8 48.1 46.5  PLT 148* 140* 136*  INR  --   --  1.33     Recent Labs  10/28/15 0526 10/28/15 2010 10/29/15 0545 10/29/15 2000 2015-06-21 0456  NA 147* 146* 147* 149* 151*  K 3.5 3.5 3.8 4.0 3.9  CL 94* 95* 96* 97* 99*  CO2 43* 42* 46* 45* 43*  GLUCOSE 142* 127* 122* 121* 130*  BUN 45* 54* 58* 70* 73*  CREATININE 1.33* 1.31* 1.32* 1.47* 1.39*  CALCIUM 8.3* 8.3* 8.3* 8.5* 8.7*  MG 1.9  --   --   --   --   PROT  --   --   --   --  6.7  ALBUMIN  --   --   --   --  2.8*  AST  --   --   --   --  29  ALT  --   --   --   --  46  ALKPHOS  --   --   --   --  40  BILITOT  --   --   --   --  0.7  TRIG  --  106  --   --   --    Estimated Creatinine Clearance: 164.3 mL/min (by C-G formula based on Cr of 1.39).    Recent Labs  10/29/15 1616 10/29/15 1924 2015-06-21 0336  GLUCAP 131* 134* 127*    Medical History: Past Medical History  Diagnosis Date  . Morbid obesity (HCC)   . Gout   . History of echocardiogram     a. a. echo 04/15/2015: EF 60-65%, no WMA, LA nl in size, RV poorly visualized, PASP nl, grossly nl study, challening study 2/2 body habitus   . CHF (congestive heart failure) (HCC)     Medications:  Scheduled:  . allopurinol  100 mg Per Tube Daily  .  antiseptic oral rinse  7 mL Mouth Rinse QID  . aspirin  325 mg Per Tube Daily  . azithromycin  500 mg Intravenous Q24H  . budesonide  0.25 mg Nebulization Q6H  . cefTAZidime (FORTAZ)  IV  2 g Intravenous 3 times per day  . chlorhexidine gluconate  15 mL Mouth Rinse BID  . famotidine (PEPCID) IV  20 mg Intravenous Q12H  . feeding supplement (PRO-STAT SUGAR FREE 64)  30 mL Oral 6 times per day  . free water  150 mL Per Tube 6 times per day  . ipratropium-albuterol  3 mL Nebulization Q6H  . magnesium oxide  400 mg Per Tube BID  . methylPREDNISolone (SOLU-MEDROL) injection  60 mg Intravenous Q8H  . polyethylene glycol  17 g Oral Daily  . senna-docusate  1 tablet Oral BID  . sodium chloride  3 mL Intravenous Q12H   Infusions:  . feeding supplement (VITAL HIGH PROTEIN) 1,000  mL (10/28/2015 0600)  . furosemide (LASIX) infusion 8 mg/hr (11/03/2015 0600)  . heparin 2,250 Units/hr (11/03/2015 0600)  . propofol (DIPRIVAN) infusion 50 mcg/kg/min (11/13/2015 8119)     Assessment: Pharmacy consulted to assist in managing electrolytes in this [name] with diastolic HF and morbid obesity.   Plan:  Potassium is low at 2.8 and being replaced by MD with 6 runs of 10 meq IV. Will recheck potassium level with am labs. Magnesium is WNL at 2.0.   11/30 K+ 3.1, PO4 slightly elevated. 20 mEq KCl per tube x 2 doses ordered. Recheck BMP in AM.  12/1 AM electrolyte panel, no replacement indicated. F/u BMP tomorrow AM.  12/2 AM electrolyte panel, no replacement indicated. F/u BMP tomorrow AM.  12/3 AM electrolyte panel, no replacement indicated. F/u BMP tomorrow AM.  Fulton Reek, PharmD, BCPS 11/03/2015 6:51 AM

## 2015-10-30 NOTE — Progress Notes (Signed)
Pt. Had 20 mL of dark red-black residual, called and spoke with Dr. Dimas AguasHoward.  CMP and INR were added to morning labs, heparin will continue at current rate (see EMR for details).  Will continue to monitor pt. closely

## 2015-10-30 NOTE — Progress Notes (Signed)
[facility] Reader Critical Care Medicine Progess Note    ASSESSMENT/PLAN   This is a 29 year old male with super obesity, congestive heart failure, obesity hypoventilation syndrome. He presents with acute respiratory failure with severe hypoxia, thought to be secondary to acute CHF exacerbation.  PULMONARY A: Acute on chronic hypoxic and hypercarbic respiratory failure Acute pulmonary edema secondary to acute diastolic congestive heart failure. ?ARDS-appears unlikely given fairly good lung compliance. On empiric steroids.  ?Pneumonia--on empiric abx.  OHS/OSA Smoker Bronchospasm Acute on chronic hypercapnia, bicarb and CO2 increasing due to diuresis PEEP still at 20, FiO2 down to 65%, currently on High PEEP recruitment maneuvers  P:  -Continue lasix drip today, may consider converting as creatine increases.  Vent bundle implemented Continue periodic recruitment maneuvers.  Continue daily wake up assessments.  --May need paralytics if develops worsening hypoxemia.  Scheduled nebulized steroids Scheduled and PRN nebulized bronchodilators Empiric full dose heparin--LE Dopplers negative.  Empiric moderate dose steroids.  require tracheostomy for definitive life-saving airway and for treatment of his OSA/OHS  Mother updated on current clinical status and has requested transfer to [facility]  CARDIOVASCULAR A:  Diastolic HF  P:  Continue diuresis, minimize IV fluids.  RENAL A:  Hypervolemia, likely chronic AKI (baseline Cr 0.84) P:  Kidney injury, slightly improved from admission.  GASTROINTESTINAL A:  Morbid obesity P:  SUP: IV famotidine   HEMATOLOGIC A:  No issues P:  DVT px: full dose heparin  Monitor CBC intermittently Transfuse per usual guidelines  INFECTIOUS A:  No identified infections P:  Monitor temp, WBC count Micro and abx as above PCT protocol ordered 11/28  Sputum culture 10/26/2015; pending Ceftaz . 12-1>>  ENDOCRINE A:   Mild hyperglycemia without documented history of DM P:  Monitor glu on chem panels SSI   NEUROLOGIC A:  Acute encephalopathy of critical illness Intermittent agitation with ventilator dyssynchrony P:  RASS goal: -2, -3 Propofol gtt PRN fentanyl PRN vecuronium for vent dyssynch   Chest x-ray 10/27/2015; significantly improved pulmonary edema, morbid obesity. LE dopplers 10/26/15: negative for DVT.  Echo 10/26/15; EF=55%; biventricular dilatation.   ---------------------------------------   ----------------------------------------   Name: Theodore Reid MRN: [id number] DOB: Apr 16, 1986    ADMISSION DATE:  10/25/2015    SUBJECTIVE/review of systems:   Pt currently on the ventilator, can not provide history or review of systems. Down to FiO2 65%    VITAL SIGNS: Temp:  [100.6 F (38.1 C)-102.4 F (39.1 C)] 102.4 F (39.1 C) (12/03 0400) Pulse Rate:  [91-112] 93 (12/03 0700) Resp:  [18-28] 18 (12/03 0700) BP: (103-127)/(48-74) 109/60 mmHg (12/03 0700) SpO2:  [90 %-100 %] 97 % (12/03 0700) FiO2 (%):  [55 %-100 %] 55 % (12/03 0749) Weight:  [567 lb 7.4 oz (257.4 kg)] 567 lb 7.4 oz (257.4 kg) (12/03 0200) HEMODYNAMICS:   VENTILATOR SETTINGS: Vent Mode:  [-] PRVC FiO2 (%):  [55 %-100 %] 55 % Set Rate:  [18 bmp] 18 bmp Vt Set:  [600 mL] 600 mL PEEP:  [20 cmH20] 20 cmH20 Plateau Pressure:  [28 cmH20-38 cmH20] 28 cmH20 INTAKE / OUTPUT:  Intake/Output Summary (Last 24 hours) at 11/11/2015 0906 Last data filed at 11/11/2015 0600  Gross per 24 hour  Intake 3164.15 ml  Output   2583 ml  Net 581.15 ml    PHYSICAL EXAMINATION: Physical Examination:   VS: BP 109/60 mmHg  Pulse 93  Temp(Src) 102.4 F (39.1 C) (Oral)  Resp 18  Ht  (1.803 m)  Wt 567 lb 7.4  oz (257.4 kg)  BMI 79.18 kg/m2  SpO2 97%  General Appearance: On ventilator Neuro:without focal findings, mental status normal. HEENT: PERRLA, EOM intact. Pulmonary: normal breath sounds    CardiovascularNormal S1,S2.  No m/r/g.   Abdomen: Benign, Soft, non-tender. Renal:  No costovertebral tenderness  GU:  Not performed at this time. Endocrine: No evident thyromegaly. Skin:   warm, no rashes, no ecchymosis  Extremities: normal, no cyanosis, clubbing.   LABS:   LABORATORY PANEL:   CBC  Recent Labs Lab 2015-11-23 0456  WBC 15.7*  HGB 13.4  HCT 46.5  PLT 136*    Chemistries   Recent Labs Lab 10/27/15 0430  10/28/15 0526  11-23-2015 0456  NA 145  < > 147*  < > 151*  K 3.1*  < > 3.5  < > 3.9  CL 93*  < > 94*  < > 99*  CO2 43*  < > 43*  < > 43*  GLUCOSE 105*  < > 142*  < > 130*  BUN 39*  < > 45*  < > 73*  CREATININE 1.26*  < > 1.33*  < > 1.39*  CALCIUM 8.2*  < > 8.3*  < > 8.7*  MG 1.7  --  1.9  --   --   PHOS 5.3*  --   --   --   --   AST  --   --   --   --  29  ALT  --   --   --   --  46  ALKPHOS  --   --   --   --  40  BILITOT  --   --   --   --  0.7  < > = values in this interval not displayed.   Recent Labs Lab 10/29/15 1137 10/29/15 1616 10/29/15 1924 10/29/15 2323 2015/11/23 0336 Nov 23, 2015 0654  GLUCAP 117* 131* 134* 124* 127* 139*    Recent Labs Lab 10/27/15 0500 10/28/15 0414 10/29/15 0450  PHART 7.46* 7.42 7.39  PCO2ART 67* 74* 84*  PO2ART 58* 77* 67*    Recent Labs Lab 10/26/15 0646 Nov 23, 2015 0456  AST 85* 29  ALT 147* 46  ALKPHOS 56 40  BILITOT 1.4* 0.7  ALBUMIN 2.8* 2.8*    Cardiac Enzymes  Recent Labs Lab 10/25/15 1138  TROPONINI <0.03    RADIOLOGY:  Dg Chest 1 View  10/29/2015  CLINICAL DATA:  Shortness of breath. EXAM: CHEST 1 VIEW FINDINGS: Endotracheal tube and left IJ line in stable position. Cardiomegaly with persistent bilateral pulmonary infiltrates suggesting congestive heart failure with persistent pulmonary edema. Bilateral pneumonia cannot be excluded. Small pleural effusions cannot be excluded. No pneumothorax. IMPRESSION: 1. Lines and tubes in stable position. 2. Severe cardiomegaly with  persistent bilateral pulmonary alveolar infiltrates, right side greater left. Findings suggest congestive heart failure. Bilateral pneumonia cannot be excluded. Small pleural effusions cannot be excluded. No significant improvement from prior exam. Electronically Signed   By: Maisie Fus  Register   On: 10/29/2015 07:17       I have personally obtained a history, examined the patient, evaluated laboratory and imaging results, formulated the assessment and plan and placed orders. CRITICAL CARE: The patient is critically ill with multiple organ systems failure and requires high complexity decision making for assessment and support, frequent evaluation and titration of therapies, application of advanced monitoring technologies and extensive interpretation of multiple databases. Critical Care Time devoted to patient care services described in this note is 45 minutes.  Stephanie AcreVishal Jaeven Wanzer, MD [facility] Pager [phone] (please enter 7-digits) On Call Pager - [phone] (please enter 7-digits)

## 2015-10-30 NOTE — Code Documentation (Signed)
  Patient Name: Theodore Reid   MRN: [id number]   Date of Birth/ Sex: November 13, 1986 , male      Admission Date: 11/03/2015  Attending Provider: Roslynn AmbleJennings E Nestor, MD  Primary Diagnosis: <principal problem not specified>   Indication: Pt was in his usual state of health until this PM, when he was noted to be in asystole. Code blue was subsequently called. At the time of arrival on scene, ACLS protocol was underway.   Technical Description:  - CPR performance duration:  7 minutes   - Was defibrillation or cardioversion used? Yes   - Was external pacer placed? No  - Was patient intubated pre/post CPR? Yes   Medications Administered: Y = Yes; Blank = No Amiodarone    Atropine    Calcium    Epinephrine  Y  Lidocaine    Magnesium    Norepinephrine    Phenylephrine    Sodium bicarbonate  Y  Vasopressin     Post CPR evaluation:  - Final Status - Was patient successfully resuscitated ? Yes - What is current rhythm? Sinus tach - What is current hemodynamic status? hypotensive  Miscellaneous Information:  - Labs sent, including: Cbc, bmet, trop, lactic acid, mag.  - Primary team notified?  Yes  - Family Notified? Yes  - Additional notes/ transfer status: Already in ICU     Hyacinth Meekerasrif Shaaron Golliday, MD  11/16/2015, 11:56 PM   Patient coded second time within few minutes. Was in Asystole, received epi and bicarb. Rhythm changed to Vfib, was shocked, received amio.then rhythem appeared to be Torsades, received mag. Patient regained ROSC. Was in DeQuincyVtach, was loaded with amio and placed on amio drip. [facility]. They took over care from this point as I had to go to another code.

## 2015-10-30 NOTE — Progress Notes (Signed)
Called report to [place], RN @ [facility]  Alerted Pt.'s mother(emergency contact in chart) and gave her room number and location as well as phone number to front desk if needs any further information. Gave report to Mysti, RN from [facility] who left with pt. In stable condition @ 2115. Pt. Is being transported with a ventilator, IV gtts (see EMR for details), OGT and continuous VS monitoring.

## 2015-10-31 DIAGNOSIS — A419 Sepsis, unspecified organism: Secondary | ICD-10-CM

## 2015-10-31 LAB — BLOOD GAS, ARTERIAL
Acid-Base Excess: 0.3 mmol/L (ref 0.0–2.0)
Acid-Base Excess: 4.3 mmol/L — ABNORMAL HIGH (ref 0.0–2.0)
BICARBONATE: 26.2 meq/L — AB (ref 20.0–24.0)
Bicarbonate: 28.2 mEq/L — ABNORMAL HIGH (ref 20.0–24.0)
DRAWN BY: 24513
FIO2: 0.7
FIO2: 100
MECHVT: 400 mL
MECHVT: 600 mL
O2 SAT: 91.7 %
O2 Saturation: 98.2 %
PCO2 ART: 41.3 mmHg (ref 35.0–45.0)
PEEP: 20 cmH2O
PEEP: 20 cmH2O
PH ART: 7.286 — AB (ref 7.350–7.450)
PO2 ART: 127 mmHg — AB (ref 80.0–100.0)
Patient temperature: 98.6
Patient temperature: 98.7
RATE: 20 resp/min
RATE: 30 resp/min
TCO2: 27.9 mmol/L (ref 0–100)
TCO2: 29.4 mmol/L (ref 0–100)
pCO2 arterial: 56.8 mmHg — ABNORMAL HIGH (ref 35.0–45.0)
pH, Arterial: 7.448 (ref 7.350–7.450)
pO2, Arterial: 73.1 mmHg — ABNORMAL LOW (ref 80.0–100.0)

## 2015-10-31 LAB — CBC WITH DIFFERENTIAL/PLATELET
BASOS ABS: 0 10*3/uL (ref 0.0–0.1)
Basophils Relative: 0 %
EOS PCT: 0 %
Eosinophils Absolute: 0 10*3/uL (ref 0.0–0.7)
HEMATOCRIT: 52.9 % — AB (ref 39.0–52.0)
HEMOGLOBIN: 14.1 g/dL (ref 13.0–17.0)
LYMPHS ABS: 4.9 10*3/uL — AB (ref 0.7–4.0)
LYMPHS PCT: 17 %
MCH: 21.8 pg — AB (ref 26.0–34.0)
MCHC: 26.7 g/dL — ABNORMAL LOW (ref 30.0–36.0)
MCV: 81.8 fL (ref 78.0–100.0)
MONOS PCT: 5 %
Monocytes Absolute: 1.5 10*3/uL — ABNORMAL HIGH (ref 0.1–1.0)
NEUTROS ABS: 22.7 10*3/uL — AB (ref 1.7–7.7)
Neutrophils Relative %: 78 %
Platelets: 119 10*3/uL — ABNORMAL LOW (ref 150–400)
RBC: 6.47 MIL/uL — ABNORMAL HIGH (ref 4.22–5.81)
RDW: 20.9 % — ABNORMAL HIGH (ref 11.5–15.5)
WBC: 29.1 10*3/uL — ABNORMAL HIGH (ref 4.0–10.5)

## 2015-10-31 LAB — BASIC METABOLIC PANEL
ANION GAP: 28 — AB (ref 5–15)
BUN: 81 mg/dL — ABNORMAL HIGH (ref 6–20)
CHLORIDE: 95 mmol/L — AB (ref 101–111)
CO2: 24 mmol/L (ref 22–32)
Calcium: 7.2 mg/dL — ABNORMAL LOW (ref 8.9–10.3)
Creatinine, Ser: 3.02 mg/dL — ABNORMAL HIGH (ref 0.61–1.24)
GFR calc Af Amer: 31 mL/min — ABNORMAL LOW (ref >60)
GFR, EST NON AFRICAN AMERICAN: 26 mL/min — AB (ref >60)
Glucose, Bld: 114 mg/dL — ABNORMAL HIGH (ref 65–99)
POTASSIUM: 6 mmol/L — AB (ref 3.5–5.1)
SODIUM: 147 mmol/L — AB (ref 135–145)

## 2015-10-31 LAB — COMPREHENSIVE METABOLIC PANEL
ALK PHOS: 64 U/L (ref 38–126)
ALK PHOS: 84 U/L (ref 38–126)
ALT: 1010 U/L — AB (ref 17–63)
ALT: 1540 U/L — AB (ref 17–63)
ANION GAP: 23 — AB (ref 5–15)
AST: 1829 U/L — ABNORMAL HIGH (ref 15–41)
AST: 3216 U/L — ABNORMAL HIGH (ref 15–41)
Albumin: 2 g/dL — ABNORMAL LOW (ref 3.5–5.0)
Albumin: 2.2 g/dL — ABNORMAL LOW (ref 3.5–5.0)
Anion gap: 24 — ABNORMAL HIGH (ref 5–15)
BILIRUBIN TOTAL: 1.6 mg/dL — AB (ref 0.3–1.2)
BUN: 73 mg/dL — ABNORMAL HIGH (ref 6–20)
BUN: 77 mg/dL — ABNORMAL HIGH (ref 6–20)
CALCIUM: 9.4 mg/dL (ref 8.9–10.3)
CALCIUM: 8.3 mg/dL — AB (ref 8.9–10.3)
CO2: 34 mmol/L — ABNORMAL HIGH (ref 22–32)
CO2: 32 mmol/L (ref 22–32)
CREATININE: 2.11 mg/dL — AB (ref 0.61–1.24)
CREATININE: 2.29 mg/dL — AB (ref 0.61–1.24)
Chloride: 95 mmol/L — ABNORMAL LOW (ref 101–111)
Chloride: 94 mmol/L — ABNORMAL LOW (ref 101–111)
GFR, EST AFRICAN AMERICAN: 47 mL/min — AB (ref >60)
GFR, EST AFRICAN AMERICAN: 43 mL/min — AB (ref >60)
GFR, EST NON AFRICAN AMERICAN: 41 mL/min — AB (ref >60)
GFR, EST NON AFRICAN AMERICAN: 37 mL/min — AB (ref >60)
Glucose, Bld: 135 mg/dL — ABNORMAL HIGH (ref 65–99)
Glucose, Bld: 60 mg/dL — ABNORMAL LOW (ref 65–99)
Potassium: 6.4 mmol/L (ref 3.5–5.1)
Potassium: 5.8 mmol/L — ABNORMAL HIGH (ref 3.5–5.1)
SODIUM: 152 mmol/L — AB (ref 135–145)
Sodium: 150 mmol/L — ABNORMAL HIGH (ref 135–145)
TOTAL PROTEIN: 6.1 g/dL — AB (ref 6.5–8.1)
Total Bilirubin: 2.2 mg/dL — ABNORMAL HIGH (ref 0.3–1.2)
Total Protein: 6.6 g/dL (ref 6.5–8.1)

## 2015-10-31 LAB — CARBOXYHEMOGLOBIN
Carboxyhemoglobin: 1.1 % (ref 0.5–1.5)
METHEMOGLOBIN: 1 % (ref 0.0–1.5)
O2 Saturation: 61.7 %
TOTAL HEMOGLOBIN: 14.9 g/dL (ref 13.5–18.0)

## 2015-10-31 LAB — PHOSPHORUS
PHOSPHORUS: 11.9 mg/dL — AB (ref 2.5–4.6)
Phosphorus: 8.3 mg/dL — ABNORMAL HIGH (ref 2.5–4.6)

## 2015-10-31 LAB — TROPONIN I
TROPONIN I: 15.35 ng/mL — AB (ref <0.031)
Troponin I: >65 ng/mL (ref <0.031)

## 2015-10-31 LAB — CBC
HCT: 52.1 % — ABNORMAL HIGH (ref 39.0–52.0)
HEMOGLOBIN: 14.4 g/dL (ref 13.0–17.0)
MCH: 21.8 pg — ABNORMAL LOW (ref 26.0–34.0)
MCHC: 27.6 g/dL — ABNORMAL LOW (ref 30.0–36.0)
MCV: 78.7 fL (ref 78.0–100.0)
PLATELETS: 91 10*3/uL — AB (ref 150–400)
RBC: 6.62 MIL/uL — AB (ref 4.22–5.81)
RDW: 21 % — ABNORMAL HIGH (ref 11.5–15.5)
WBC: 18.7 10*3/uL — AB (ref 4.0–10.5)

## 2015-10-31 LAB — TYPE AND SCREEN
ABO/RH(D): O POS
ANTIBODY SCREEN: NEGATIVE

## 2015-10-31 LAB — ABO/RH: ABO/RH(D): O POS

## 2015-10-31 LAB — PROTIME-INR
INR: 1.51 — AB (ref 0.00–1.49)
PROTHROMBIN TIME: 18.2 s — AB (ref 11.6–15.2)

## 2015-10-31 LAB — APTT: aPTT: 61 seconds — ABNORMAL HIGH (ref 24–37)

## 2015-10-31 LAB — PROCALCITONIN: PROCALCITONIN: 0.3 ng/mL

## 2015-10-31 LAB — LACTIC ACID, PLASMA: Lactic Acid, Venous: 13.8 mmol/L (ref 0.5–2.0)

## 2015-10-31 LAB — MAGNESIUM
MAGNESIUM: 3.7 mg/dL — AB (ref 1.7–2.4)
MAGNESIUM: 2.8 mg/dL — AB (ref 1.7–2.4)

## 2015-10-31 MED ORDER — VANCOMYCIN HCL 10 G IV SOLR: 2000.0000 mg | INTRAVENOUS | Status: DC

## 2015-10-31 MED ORDER — AMIODARONE HCL IN DEXTROSE 360-4.14 MG/200ML-% IV SOLN
30.0000 mg/h | INTRAVENOUS | Status: DC
  Filled 2015-10-31: qty 200

## 2015-10-31 MED ORDER — PIPERACILLIN-TAZOBACTAM 3.375 G IVPB 30 MIN
3.3750 g | INTRAVENOUS | Status: DC
  Filled 2015-10-31: qty 50

## 2015-10-31 MED ORDER — VANCOMYCIN HCL 10 G IV SOLR
2500.0000 mg | Freq: Once | INTRAVENOUS | Status: DC
  Filled 2015-10-31: qty 2500

## 2015-10-31 MED ORDER — MIDAZOLAM HCL 2 MG/2ML IJ SOLN
INTRAMUSCULAR | Status: AC
  Administered 2015-10-31: 2 mg
  Filled 2015-10-31: qty 2

## 2015-10-31 MED ORDER — MIDAZOLAM HCL 2 MG/2ML IJ SOLN
2.0000 mg | Freq: Once | INTRAMUSCULAR | Status: AC
  Administered 2015-10-31: 1 mg via INTRAVENOUS

## 2015-10-31 MED ORDER — AMIODARONE HCL IN DEXTROSE 360-4.14 MG/200ML-% IV SOLN
INTRAVENOUS | Status: AC
  Administered 2015-10-31: 60 mg/h
  Filled 2015-10-31: qty 200

## 2015-10-31 MED ORDER — PIPERACILLIN-TAZOBACTAM 3.375 G IVPB
3.3750 g | Freq: Three times a day (TID) | INTRAVENOUS | Status: DC
  Filled 2015-10-31 (×3): qty 50

## 2015-10-31 MED ORDER — MIDAZOLAM HCL 2 MG/2ML IJ SOLN
2.0000 mg | Freq: Once | INTRAMUSCULAR | Status: AC
  Administered 2015-10-31: 2 mg via INTRAVENOUS

## 2015-10-31 MED ORDER — MIDAZOLAM HCL 2 MG/2ML IJ SOLN
INTRAMUSCULAR | Status: AC
  Filled 2015-10-31: qty 2

## 2015-10-31 MED ORDER — SODIUM CHLORIDE 0.9 % IV BOLUS (SEPSIS)
1000.0000 mL | Freq: Once | INTRAVENOUS | Status: AC
  Administered 2015-10-31: 1000 mL via INTRAVENOUS

## 2015-10-31 MED ORDER — SODIUM BICARBONATE 8.4 % IV SOLN
INTRAVENOUS | Status: AC
  Filled 2015-10-31: qty 50

## 2015-10-31 MED ORDER — SODIUM BICARBONATE 8.4 % IV SOLN
50.0000 meq | Freq: Once | INTRAVENOUS | Status: AC
  Administered 2015-10-31: 50 meq via INTRAVENOUS

## 2015-10-31 MED ORDER — AMIODARONE HCL IN DEXTROSE 360-4.14 MG/200ML-% IV SOLN
60.0000 mg/h | INTRAVENOUS | Status: AC
  Administered 2015-10-31: 60 mg/h via INTRAVENOUS

## 2015-10-31 MED ORDER — SODIUM BICARBONATE 8.4 % IV SOLN
INTRAVENOUS | Status: AC
  Filled 2015-10-31: qty 100

## 2015-10-31 MED ORDER — DEXTROSE 5 % IV SOLN
0.0000 ug/min | INTRAVENOUS | Status: DC
  Administered 2015-10-31: 200 ug/min via INTRAVENOUS
  Filled 2015-10-31 (×2): qty 4

## 2015-10-31 MED ORDER — DOBUTAMINE IN D5W 4-5 MG/ML-% IV SOLN
2.5000 ug/kg/min | INTRAVENOUS | Status: DC
  Administered 2015-10-31: 2.5 ug/kg/min via INTRAVENOUS
  Filled 2015-10-31: qty 250

## 2015-10-31 MED ORDER — HYDROCORTISONE NA SUCCINATE PF 100 MG IJ SOLR: 100.0000 mg | Freq: Four times a day (QID) | INTRAMUSCULAR | Status: DC

## 2015-10-31 MED ORDER — NOREPINEPHRINE BITARTRATE 1 MG/ML IV SOLN
0.0000 ug/min | INTRAVENOUS | Status: DC
  Administered 2015-10-31: 100 ug/min via INTRAVENOUS
  Filled 2015-10-31 (×2): qty 16

## 2015-11-02 LAB — MYCOPLASMA PNEUMONIAE ANTIBODY, IGM: Mycoplasma pneumo IgM: <770 U/mL (ref 0–769)

## 2015-11-02 LAB — RESPIRATORY VIRUS PANEL
ADENOVIRUS: NEGATIVE
Influenza A: NEGATIVE
Influenza B: NEGATIVE
METAPNEUMOVIRUS: NEGATIVE
PARAINFLUENZA 1 A: NEGATIVE
PARAINFLUENZA 2 A: NEGATIVE
Parainfluenza 3: NEGATIVE
RESPIRATORY SYNCYTIAL VIRUS B: NEGATIVE
RHINOVIRUS: POSITIVE — AB
Respiratory Syncytial Virus A: NEGATIVE

## 2015-11-02 LAB — CHLAMYDIA PANEL SERUM
C. Pneumoniae IgG Serum: 1:256 {titer} — AB
C. Trachomatis IgG Serum: <1:64 {titer}
C. Trachomatis IgM Serum: <1:20 {titer}

## 2015-11-02 MED FILL — Medication: Qty: 1 | Status: AC

## 2015-11-03 LAB — CULTURE, BLOOD (ROUTINE X 2)
CULTURE: NO GROWTH
Culture: NO GROWTH

## 2015-11-03 NOTE — Addendum Note (Signed)
Addendum  created 11/03/15 1838 by Yves DillPaul Abigayle Wilinski, MD   Modules edited: Clinical Notes, Notes Section   Clinical Notes:  File: [id number]; File: [id number]   Notes Section:  File: [id number]

## 2015-11-05 ENCOUNTER — Telehealth: Payer: Self-pay

## 2015-11-05 LAB — CULTURE, BLOOD (ROUTINE X 2)
CULTURE: NO GROWTH
CULTURE: NO GROWTH

## 2015-11-05 NOTE — Telephone Encounter (Signed)
On 11/05/2015 I received a death certificate from [employer]. The death certificate is for burial. The patient is a patient of [facility]. [facility] for signature. On 11/08/2015 I received the death certificate back from [employer] [facility]. I got the death certificate ready and called the funeral home to let them know the death certificate is ready for pickup.

## 2015-11-08 NOTE — Telephone Encounter (Signed)
Received records request Disability Determination SErvices, forwarded to HIM ELAM  for processing.

## 2015-11-28 NOTE — Progress Notes (Signed)
Pt arrived on 2MW at 2215, HR 110-120's. Right radial pulse felt faint, pedal pulses dopplerable. Shortly after noted pts HR in the 50's and no pulse palpated. Code initiated Dr. Donnamae JudeLo Verde notified. Pts mother notified and requested presence. Pt. Lost pulse a total of 3 times, CPR w/ drugs given with circulation returned each time. Will continue to monitor. See code sheet for meds.

## 2015-11-28 NOTE — Progress Notes (Signed)
Pt maxed out on all gtts and BP still dropping. Dr. Donnamae JudeLo Verde at bedside and all orders carried out. At 7:07 pt lost pulse and code initiated with CPR and drugs. Code called by MD at 7:17 after all measures done and pt noted with no pulse. Family in waiting room notified.

## 2015-11-28 NOTE — Plan of Care (Signed)
CODE BLUE NOTE  At the patient's bedside when BP dropped and pulse was lost.  PEA algorithm activated CPR x2210min, Epi pushes, patient developed VT without pulse, defibrillation was done.  Pulse returned for 1 min then was lost again.  As this was the patient's 5rth arrest in 8 hours, and no reversible cause was identified, the care team decided unanimously to conclude and the patient was pronounced dead @ 0717 on 11/05/2015 of cardiac arrest from cardiogenic shock, septic shock, Myocardial infarction vs massive PE with contraindication to heparin given acute GIB 4 hours ago.  The family was notified by me, and they accepted the news and thanked the hospital staff.  Autopsy was not discussed as the patient's mother MDPOA was in route and does not yet know of his passing.   Theodore Profferaniel Breonia Kirstein, DO., MS [facility]

## 2015-11-28 NOTE — Progress Notes (Signed)
CRITICAL VALUE ALERT  Critical value received:  Troponin 15.35  Date of notification:  11/01/2015  Time of notification:  2235  Critical value read back:Yes.    Nurse who received alert:  Louie BunSummer Pragya Lofaso, RN  MD notified (1st page):  Dr. Donnamae JudeLo Verde  Time of first page:  2235  MD notified (2nd page):  Time of second page:  Responding MD:  Dr. Donnamae JudeLo Verde  Time MD responded:  2235

## 2015-11-28 NOTE — Progress Notes (Signed)
   10-25-15 0100  Clinical Encounter Type  Visited With Family  Visit Type Spiritual support  Referral From Nurse  Spiritual Encounters  Spiritual Needs Prayer;Emotional;Grief support  Stress Factors  Family Stress Factors Lack of knowledge;Health changes  [facility] Nurses notified mother of occurrence. After 3rd code, mother summoned to hospital. Chaplain provided logistical support and prayer as well as emotional support to mother. Code occurred 2215, chaplain left family to respond to ED at 0049.

## 2015-11-28 NOTE — H&P (Signed)
PULMONARY / CRITICAL CARE MEDICINE   Name: Theodore Reid MRN: [id number] DOB: Sep 27, 1986    ADMISSION DATE:  11/07/2015  CHIEF COMPLAINT:  SOB/DOE/Shock  INITIAL PRESENTATION:  21 AAM with h/o super-morbid obesity, HFpEF.  The history is provided by his mother. He presented to [facility] on 11/28 for one week of progressive SOB with 50ft DOE, orthopnea, cough non-productive.  He was seen by his PCP who prescribed him metolazone in addition to his home lasix but his mother states his UOP did not increase.  She states he did not have any fevers, chills, sick contacts.  Of note his mother states prior to admission he had been taking Ibuprofen 800mg  Qday x7 days, no other NSAIDS or blood thinners.   He has an unremarkable childhood and he nor his immediate family have any known cardiac conditions other than his underlying CHF.  (Congenital long QT, CAD, etc)  He underwent diuresis at [facility] and per report from his mother his vent requirements were improving.  The were told that he would need a tracheostomy and so he was transferred to [facility].    SIGNIFICANT EVENTS: Upon arrival he suffered a cardiac arrest x3.  First rhythm was polymorphic VT, he was shocked and given Magnesium and epi, he then developed monomorphic VT with several intermittent bouts of Afib with RVR with a RBBB, he was given amiodarone bolus and gtt, as well as lidocaine bolus and gtt as well as electrical cardioversion as well as defibrillation.  After 35 min of intermittent CPR and shocks, ROSC was achieved, however he remained in shock requiring 3 vasopressors.  He had a black tarry BM while in the ICU.   PAST MEDICAL HISTORY :   has a past medical history of Morbid obesity (HCC); Gout; History of echocardiogram; and CHF (congestive heart failure) (HCC).  has past surgical history that includes Tonsillectomy. Prior to Admission medications   Medication Sig Start Date End Date Taking? Authorizing Provider   acetaminophen (TYLENOL) 325 MG tablet Take 650 mg by mouth every 6 (six) hours as needed for headache.    Historical Provider, MD  albuterol (PROVENTIL HFA;VENTOLIN HFA) 108 (90 BASE) MCG/ACT inhaler Inhale 2 puffs into the lungs every 4 (four) hours as needed for wheezing or shortness of breath. 04/17/15   Ramonita Lab, MD  allopurinol (ZYLOPRIM) 100 MG tablet Take 1 tablet (100 mg total) by mouth daily. 06/18/15   Margaretann Loveless, PA-C  aspirin EC 81 MG tablet Take 1 tablet (81 mg total) by mouth daily. 04/17/15   Ramonita Lab, MD  furosemide (LASIX) 20 MG tablet Take 1 tablet (20 mg total) by mouth 2 (two) times daily. 05/14/15   Delma Freeze, FNP   No Known Allergies  FAMILY HISTORY:  indicated that his mother is alive. He indicated that his father is deceased. He indicated that his brother is alive.  SOCIAL HISTORY:  reports that he quit smoking about 6 months ago. His smoking use included Cigarettes. He has a 11 pack-year smoking history. He has never used smokeless tobacco. He reports that he does not drink alcohol or use illicit drugs.  REVIEW OF SYSTEMS:  Unable to obtain  SUBJECTIVE:   VITAL SIGNS: Temp:  [100.4 F (38 C)-102.4 F (39.1 C)] 100.4 F (38 C) (12/04 0029) Pulse Rate:  [89-112] 104 (12/03 2000) Resp:  [18-22] 21 (12/03 2000) BP: (103-124)/(52-69) 120/56 mmHg (12/03 2000) SpO2:  [95 %-100 %] 98 % (12/03 2000) FiO2 (%):  [50 %-70 %]  50 % (12/03 2000) Weight:  [257.4 kg (567 lb 7.4 oz)] 257.4 kg (567 lb 7.4 oz) (12/03 0200) HEMODYNAMICS:   VENTILATOR SETTINGS: Vent Mode:  [-] PRVC FiO2 (%):  [50 %-70 %] 50 % Set Rate:  [18 bmp] 18 bmp Vt Set:  [600 mL] 600 mL PEEP:  [20 cmH20] 20 cmH20 INTAKE / OUTPUT:  Intake/Output Summary (Last 24 hours) at March 14, 2015 0102 Last data filed at 11/03/2015 2245  Gross per 24 hour  Intake      3 ml  Output      0 ml  Net      3 ml    PHYSICAL EXAMINATION: General:  Intubated not sedated, non responsive to deep pain.   Supermorbid obesity Neuro:  Unable to assess.  Non responsive to deep pain. HEENT:  ETT in place, Short neck. Cardiovascular:  Distant heart sounds, now regular. Lungs:  Course BS b/l with mild wheezing, no rales. Abdomen:  Large pani present, bowel sounds present, soft to palpation. Musculoskeletal:  2+ LE pitting edema   LABS:  CBC  Recent Labs Lab 10/29/15 0545 11/25/2015 0456 11/06/2015 2235  WBC 17.3* 15.7* 29.1*  HGB 14.1 13.4 14.1  HCT 48.1 46.5 52.9*  PLT 140* 136* 119*   Coag's  Recent Labs Lab 10/26/15 0212 11/09/2015 0456 11/04/2015 2235  APTT 23*  --  61*  INR 1.30 1.33 1.51*   BMET  Recent Labs Lab 10/29/15 2000 11/20/2015 0456 11/08/2015 2235  NA 149* 151* 152*  K 4.0 3.9 6.4*  CL 97* 99* 95*  CO2 45* 43* 34*  BUN 70* 73* 73*  CREATININE 1.47* 1.39* 2.11*  GLUCOSE 121* 130* 135*   Electrolytes  Recent Labs Lab 10/26/15 1110  10/27/15 0430  10/28/15 0526  10/29/15 2000 10/28/2015 0456 11/16/2015 2235  CALCIUM  --   --  8.2*  < > 8.3*  < > 8.5* 8.7* 9.4  MG 1.3*  < > 1.7  --  1.9  --   --   --  3.7*  PHOS 5.3*  --  5.3*  --   --   --   --   --  11.9*  < > = values in this interval not displayed. Sepsis Markers  Recent Labs Lab 10/27/15 0430 10/29/15 0545 11/24/2015 0456 11/21/2015 2333  LATICACIDVEN  --   --   --  13.8*  PROCALCITON 0.16 0.61 0.43  --    ABG  Recent Labs Lab 10/29/15 0450 11/11/2015 2224 11/06/2015 2345  PHART 7.39 7.286* 7.286*  PCO2ART 84* 64.7* 56.8*  PO2ART 67* 153* 73.1*   Liver Enzymes  Recent Labs Lab 10/26/15 0646 11/23/2015 0456 11/08/2015 2235  AST 85* 29 1829*  ALT 147* 46 1010*  ALKPHOS 56 40 64  BILITOT 1.4* 0.7 1.6*  ALBUMIN 2.8* 2.8* 2.0*   Cardiac Enzymes  Recent Labs Lab 10/25/15 1138 11/05/2015 2235  TROPONINI <0.03 15.35*   Glucose  Recent Labs Lab 10/29/15 2323 10/28/2015 0336 11/04/2015 0654 11/22/2015 1118 11/04/2015 1556 11/02/2015 1926  GLUCAP 124* 127* 139* 135* 150* 129*    Imaging Dg  Chest Port 1 View  11/19/2015  CLINICAL DATA:  Acute respiratory failure. EXAM: PORTABLE CHEST 1 VIEW COMPARISON:  10/29/2015 FINDINGS: Endotracheal tube is 3 cm above the carina. Left jugular central line extends to the SVC. Ground-glass opacities persist in the central and basilar regions. No large pneumothorax. Study limited by CPR board and superimposed pacing patch. IMPRESSION: Support equipment appears satisfactorily positioned. No significant interval  change in the bilateral airspace opacities. Electronically Signed   By: Ellery Plunk M.D.   On: 11/06/2015 23:14    Bedside TTE: Poor acustical windows due to body habitus Global hypokinesis Estimated LVEF 30% No focal wall motional abnormalities noted (though poor visualization). IVC 2.4 and non colapsable No pericardial effusion.  ASSESSMENT / PLAN: 30 yo male with MODS secondary to narrow pulse pressure shock that I suspect is cardiogenic given his new RBBB and ScVO2 =61. It is unclear what precipitated his arrest, acidemia is plausible but recurrent VT and Torsade suggested of ischemic process.  Given his fever and pulmonary infiltrates sepsis is also a reasonable explanation and late stage sepsis could induce a septic cardiomyopathy.  Massive PE is also plausible but given his diffuse pulmonary infiltrates and high fever this seems less likely. At this time given his melena, heparinization is contraindicated thus excluding treatment for PE and acute coronary intervention.  We are attempting to medically optimize him and control his shock to stabilize him for further testing/treatment, though at this time his prognosis is bleak.   PULMONARY A: ARDS vs Cardiogenic pulmonary edema.  Requried 50% and 5 PEEP at OSH.  P:    - PRVC with Low tidal volume ventilation  - Liberalized TV to maximize ventilation in the setting of metabolic and respiratory acidosis  - Oral care  - PPI  CARDIOVASCULAR A: Mixed shock.  Narrow pulse pressure  shock, ddx included massive PE, cardiogenic shock from MI vs decompensated CHF vs sepsis induced cardiomyopathy.  ScVO2 61.  New RBBB, VT arrest x3 with mono and polymorphic.  P:   - supra-max does of Epi, vaso, neo, levo   - tolerateing MAP >55  - lactate and trop markedly elevated post arrest.   - trend lactate  - Dobutamine not being tolerated 2/2 hypotension.  - no coronary intervention or PE interventions possible at this time gvien melena  - Cardiology consult   - Bicarb gtt and boluses with vent to maintain pH >7.2  - Amio bolus and load  - Epinephrine for over drive pharmaco pacing and Mag repletion for Torsade  RENAL A:  Acute oliguric renal failure, 2/2 MODS and shock.  Hypokalemia likely 2/2 shock and s/p code 3-- P:    - Bicarbonate pushes and gtt to mainatin pH >7.2  - avoid nephrotoxins  - Not hemodynamically stable for HD/CRRT at this time  - Calcium repletion  GASTROINTESTINAL A:  Acute UGIB, suspect reperfusion injury.  NSAID use at home but no issues for a full week at outside  Hospital.  No history of PUD. P:    - Holding heparin products  - Type and screen  - adequate access   - Transfuse for Hb <7  HEMATOLOGIC A:  See UGIB. P:   - h/h stable now  INFECTIOUS A:  Septic shock likely.  No sick contacts but with Tmax 104. P:   BCx2 11-23-2015 Sputum12/27/16 Abx: Vanz/zosyn , start date2016/12/27.  - RVP ordered  ENDOCRINE A:  Relative adrenal insufficiency.   P:    - hydrocortison  Q6h  - FS glucose 70  NEUROLOGIC A:  No purposeful movements or withdrawal to pain.  Off sedation x5 hours now.  Likely  Septic encephalopathy vs diffuse anoxic brain injury.  P:   RASS goal: 0  - holding sedation   FAMILY  - Updates:  Throughout the night I have updated the family.  I have explain the patient's extremely poor prognosis.  His  mother states she "understands" but wishes to continue   Total critical care time: 120 min  Critical care time was  exclusive of separately billable procedures and treating other patients.  Critical care was necessary to treat or prevent imminent or life-threatening deterioration.  Critical care was time spent personally by me on the following activities: development of treatment plan with patient and/or surrogate as well as nursing, discussions with consultants, evaluation of patient's response to treatment, examination of patient, obtaining history from patient or surrogate, ordering and performing treatments and interventions, ordering and review of laboratory studies, ordering and review of radiographic studies, pulse oximetry and re-evaluation of patient's condition.   Galvin Proffer, DO., MS  Pulmonary and Critical Care Medicine        Pulmonary and Critical Care Medicine [facility] Pager: [phone]  10/29/2015, 1:02 AM

## 2015-11-28 NOTE — Progress Notes (Addendum)
CRITICAL VALUE ALERT  Critical value received:  K+ 6.4  Date of notification:  10/28/2015  Time of notification:  2235  Critical value read back:Yes.    Nurse who received alert:  Louie BunSummer Dionna Wiedemann, RN   MD notified (1st page):  Dr. Donnamae JudeLo Verde  Time of first page:  2235  MD notified (2nd page):  Time of second page:  Responding MD:  Dr. Donnamae JudeLo Verde  Time MD responded:  2235

## 2015-11-28 NOTE — Progress Notes (Signed)
CRITICAL VALUE ALERT  Critical value received:  Lactic Acid : 13.8  Date of notification:  11/10/2015  Time of notification:  0050  Critical value read back:Yes.    [facility] received [facility]  [facility]Elery [facility]adenhead  MD notified (1st page):  Elink  Time of first page:  0050  MD notified (2nd page):  Time of second page:  Responding MD:  Pola CornElink  Time MD responded:  [phone]

## 2015-11-28 NOTE — Procedures (Signed)
ARTERIAL CATHETER INSERTION   Indication: Shock Consent: yes Time out: yes Appropriate position: yes Hand washing: yes Patient Sterilized and Draped: yes Location: Left radial # of Attempts: 2 Ultrasound Guidance: yes  Line position appropriate: yes Line sutured in place: yes EBL: 2 cc Complications: no Patient Tolerated Procedure Well: yes     Galvin Profferaniel Brittanyann Wittner, DO., MS [facility]

## 2015-11-28 NOTE — Progress Notes (Signed)
ANTIBIOTIC CONSULT NOTE - INITIAL  Pharmacy Consult for Vancomycin and Zosyn Indication: sepsis  No Known Allergies  Patient Measurements:   Ht: 71 in   Wt: 257 kg  Vital Signs: Temp: 100.4 F (38 C) (12/04 0029) Temp Source: Oral (12/04 0029) BP: 120/56 mmHg (12/03 2000) Pulse Rate: 104 (12/03 2000) Intake/Output from previous day: 12/03 0701 - 12/04 0700 In: 3 [I.V.:3] Out: -  Intake/Output from this shift: Total I/O In: 3 [I.V.:3] Out: -   Labs:  Recent Labs  10/29/15 0545 10/29/15 2000 October 13, 2015 0456 October 13, 2015 2235  WBC 17.3*  --  15.7* 29.1*  HGB 14.1  --  13.4 14.1  PLT 140*  --  136* 119*  CREATININE 1.32* 1.47* 1.39* 2.11*   Estimated Creatinine Clearance: 108.2 mL/min (by C-G formula based on Cr of 2.11). No results for input(s): VANCOTROUGH, VANCOPEAK, VANCORANDOM, GENTTROUGH, GENTPEAK, GENTRANDOM, TOBRATROUGH, TOBRAPEAK, TOBRARND, AMIKACINPEAK, AMIKACINTROU, AMIKACIN in the last 72 hours.   Microbiology: Recent Results (from the past 720 hour(s))  Culture, respiratory (NON-Expectorated)     Status: None   Collection Time: 10/26/15 12:05 AM  Result Value Ref Range Status   Specimen Description TRACHEAL ASPIRATE  Final   Special Requests NONE  Final   Gram Stain   Final    FAIR SPECIMEN - 70-80% WBCS FEW WBC SEEN MODERATE GRAM POSITIVE COCCI IN PAIRS IN CHAINS    Culture Consistent with normal respiratory flora.  Final   Report Status 10/29/2015 FINAL  Final  MRSA PCR Screening     Status: None   Collection Time: 10/28/15  3:17 PM  Result Value Ref Range Status   MRSA by PCR NEGATIVE NEGATIVE Final    Comment:        The GeneXpert MRSA Assay (FDA approved for NASAL specimens only), is one component of a comprehensive MRSA colonization surveillance program. It is not intended to diagnose MRSA infection nor to guide or monitor treatment for MRSA infections.   Culture, blood (routine x 2)     Status: None (Preliminary result)   Collection  Time: 10/29/15 10:15 PM  Result Value Ref Range Status   Specimen Description BLOOD RIGHT HAND  Final   Special Requests   Final    BOTTLES DRAWN AEROBIC AND ANAEROBIC 6CC AERO 5CC ANA   Culture NO GROWTH < 12 HOURS  Final   Report Status PENDING  Incomplete  Culture, blood (routine x 2)     Status: None (Preliminary result)   Collection Time: 10/29/15 10:15 PM  Result Value Ref Range Status   Specimen Description BLOOD LEFT HAND  Final   Special Requests BOTTLES DRAWN AEROBIC AND ANAEROBIC 6CC  Final   Culture NO GROWTH < 12 HOURS  Final   Report Status PENDING  Incomplete    Medical History: Past Medical History  Diagnosis Date  . Morbid obesity (HCC)   . Gout   . History of echocardiogram     a. a. echo 04/15/2015: EF 60-65%, no WMA, LA nl in size, RV poorly visualized, PASP nl, grossly nl study, challening study 2/2 body habitus   . CHF (congestive heart failure) (HCC)     Medications:  Awaiting med rec  Assessment: 30 y.o. M presented to [facility] 11/28 for SOB. Started on heparin gtt at [facility] for r/o PE but could not be diagnosed due to body habitus. Pt on Ceftazidime (Day #5) and Azithromycin (Day #2). Pt also received one dose of 2gm Vanc on 11/30. [facility] 12/3 pm - pt coded x 3. To broaden abx to Zosyn and Vancomycin. LA 13.8. PCT 0.43. Tm 102.2. Tc 100.4. WBC up to 29.1. SCr up to 2.11, est norm CrCl 52 ml/min.  Goal of Therapy:  Vancomycin trough level 15-20 mcg/ml  Plan:  Zosyn 3.375gm IV now over 30 min then 3.375gm IV q8h - subsequent doses over 4 hours Vancomycin 2500 mg IV now then 2gm IV q24h Will f/u micro data, renal function, and pt's clinical condition Vanc trough at [facility]  Christoper Fabian, PharmD, BCPS Clinical pharmacist, pager [phone] Nov 01, 2015,1:02 AM

## 2015-11-28 NOTE — Discharge Summary (Signed)
Death Note: For complete accounting of the patient's history and physical on presentation to both our hospital as well as outside hospital please refer to the H&P dictated on 10/25/2015. Patient was a 30 year old male with known history of diastolic congestive heart failure as well as morbid obesity. He was admitted with increased dyspnea and weight gain for approximately 2 weeks duration and presumed acute on chronic diastolic congestive heart failure exacerbation. Patient was ultimately intubated for his acute hypoxic respiratory failure and further diuresed at outside hospital with improving ventilator requirements.  With the potential need for tracheostomy placement and the patient's morbid obesity he was transferred to our facility at family request. Upon arrival to the intensive care unit the patient's overt cardiac arrest 3 times. Initially he had polymorphic ventricular tachycardia. Patient underwent medication infusion as well as cardioversion per ACLS algorithm. Also during the events of the code the patient had a melanotic stool. Per documentation the patient's family was updated multiple times during the course of his previous admission to our intensive care unit and resuscitation efforts. Despite aggressive measures the patient ultimately passed around 7:00 AM on 11/20/2015.  Diagnoses at Death: 1. Cardiogenic & Septic shock 2. Polymorphic ventricular tachycardia 3. Gastrointestinal bleeding 4. Acute renal failure 5. Acute hypoxic respiratory failure 6. Acute on chronic diastolic congestive heart failure 7. Morbid obesity

## 2015-11-28 DEATH — deceased

## 2016-11-30 IMAGING — CR DG CHEST 1V
1 series · 1 of 1 positions shown · non-contrast
Comparison: 10/26/2015 and earlier.

CLINICAL DATA: 29-year-old male with shortness of Breath.
Respiratory failure. Initial encounter.

EXAM:
CHEST 1 VIEW

[ap]
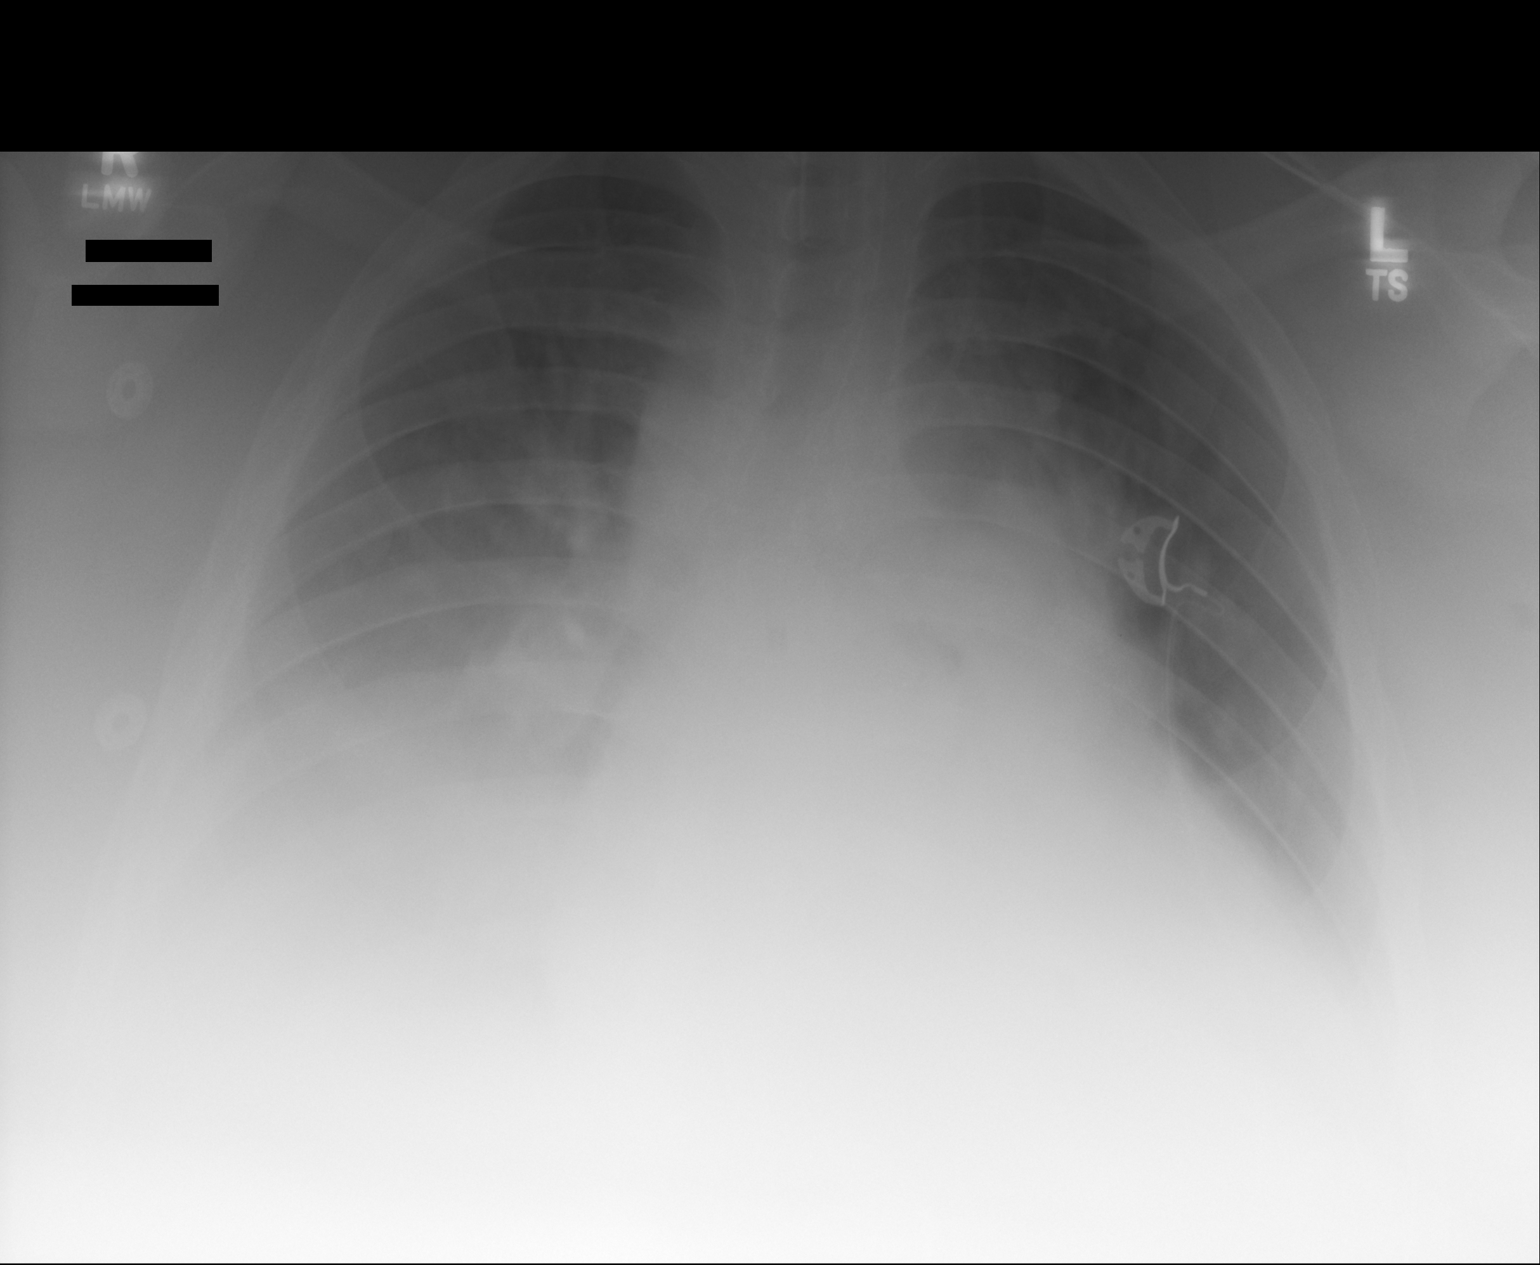

[1 of 1 positions shown; findings below may reference images not displayed]

FINDINGS: Portable AP semi upright view at 8008 hours. Endotracheal tube tip
projects just above the clavicles. Enteric tube cannot be visualized
below the level of the carina due to large body habitus. Left IJ
central line in place, tip also difficult to visualize.

Bilateral veiling pulmonary opacity greater on the right. Obscured
diaphragm. Dense retrocardiac opacity. No pneumothorax. Pulmonary
vascularity mildly improved since yesterday.
IMPRESSION: 1. Endotracheal tube tip just above the clavicles. Enteric tube and
left IJ central line tip poorly visible, primarily due to large body
habitus.
2. Bilateral pleural effusions with dense bibasilar consolidation or
atelectasis.
3. Decreased pulmonary vascular congestion since yesterday.

## 2016-12-01 IMAGING — CR DG CHEST 1V
1 series · 2 of 2 positions shown · non-contrast
Comparison: Portable chest x-ray October 27, 2015

CLINICAL DATA: CHF, respiratory failure, ARDS.

EXAM:
CHEST 1 VIEW

[Series 1: portable · 0.17mm/px · 2 of 2 slices shown]
[im 1/2]
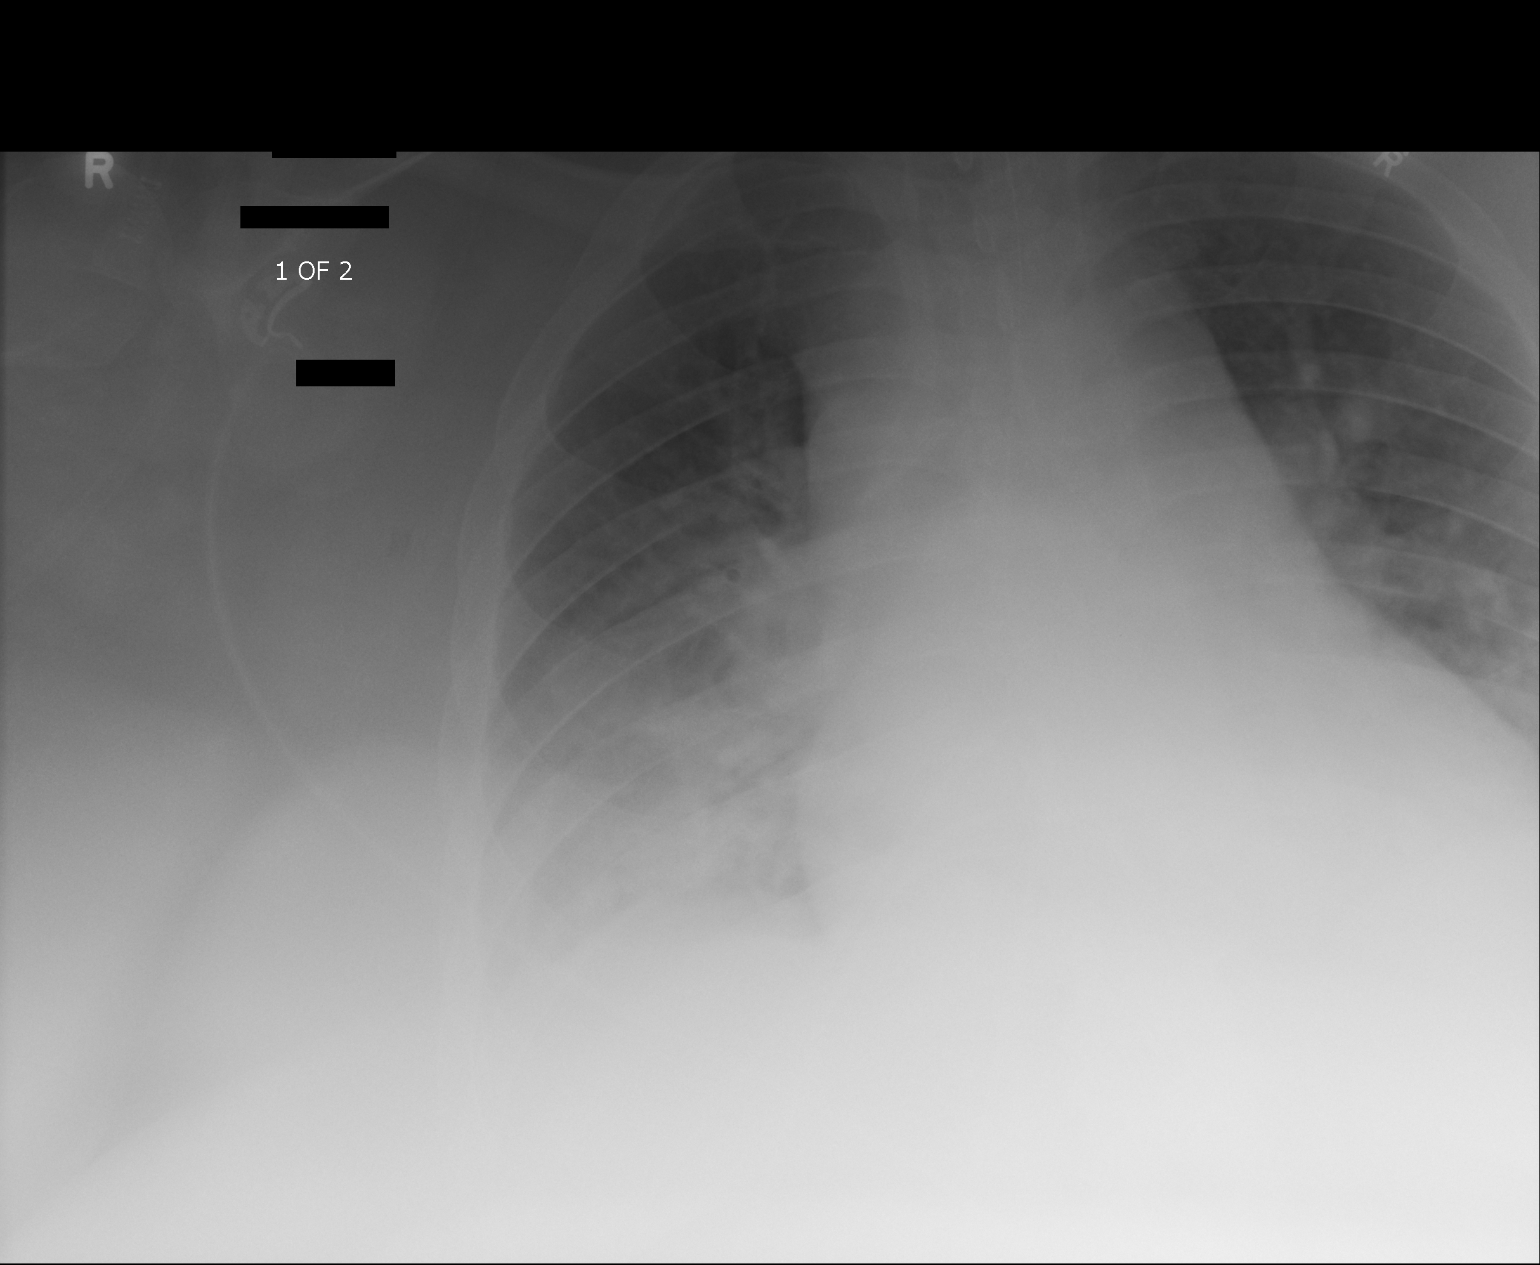
[im 2/2]
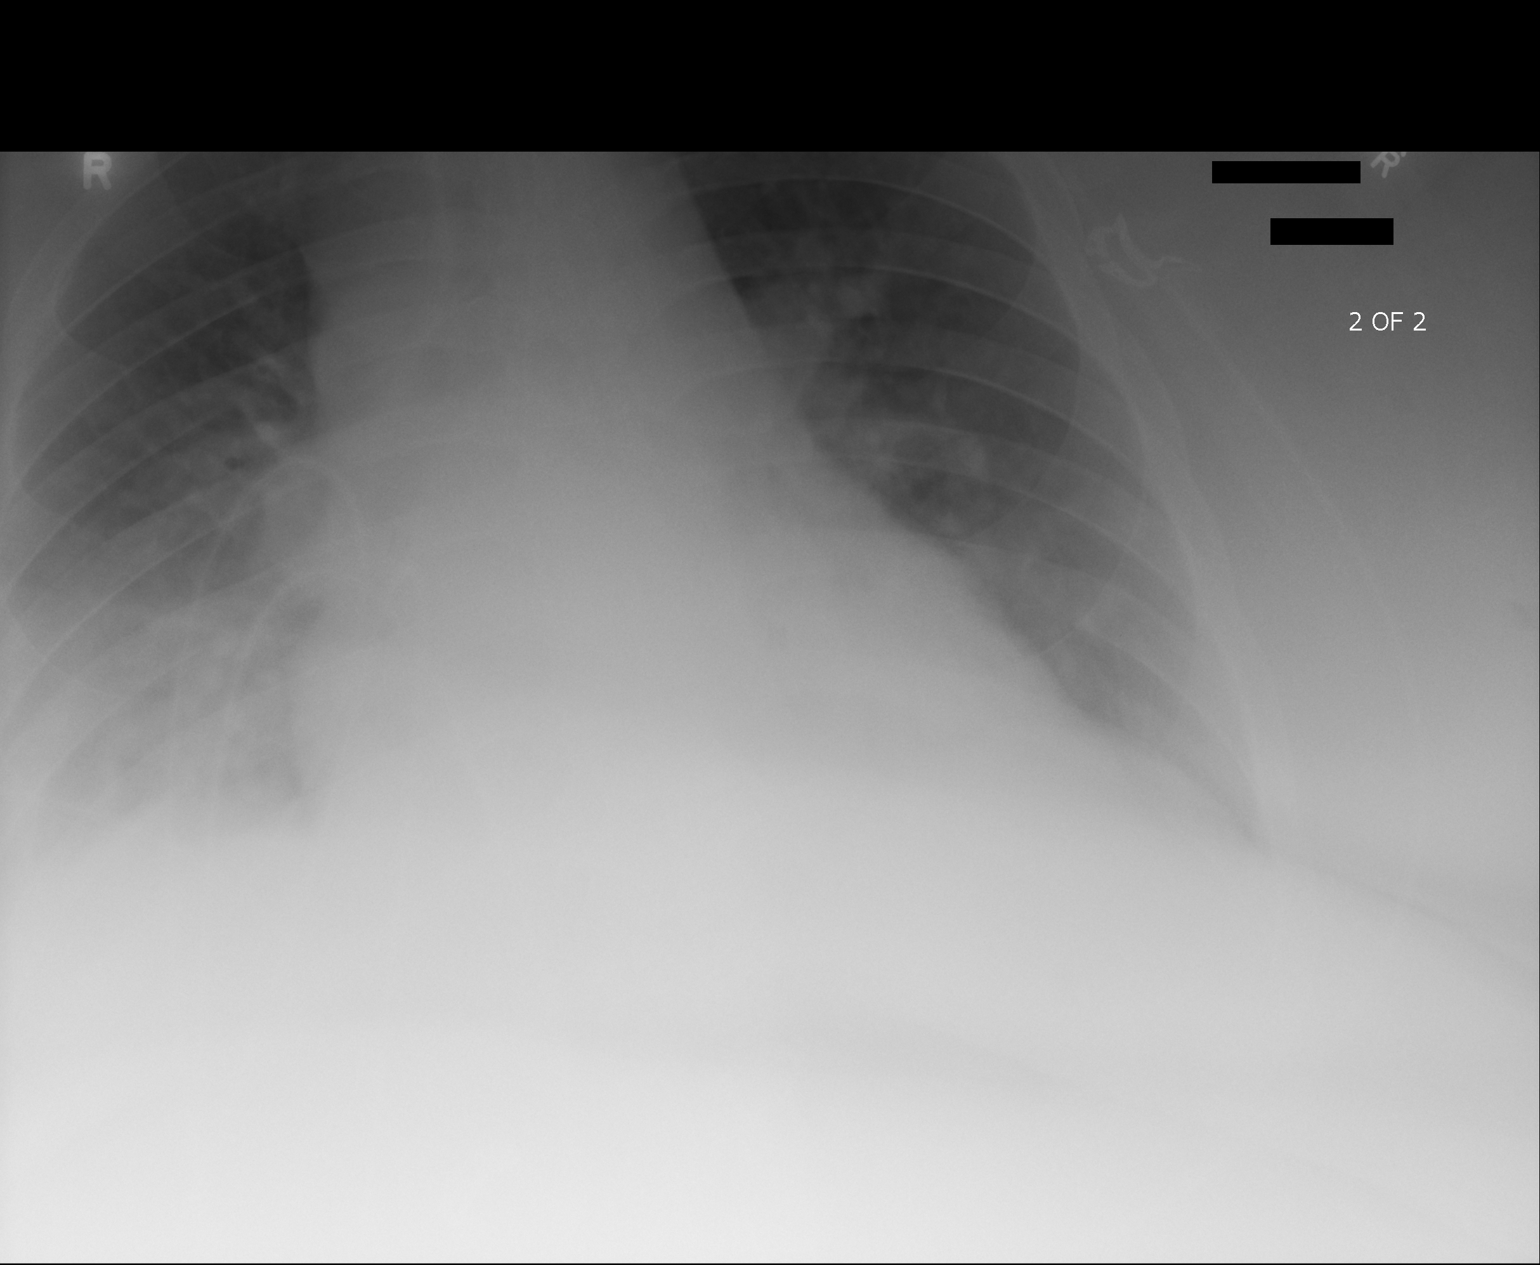

[2 of 2 positions shown; findings below may reference images not displayed]

FINDINGS: The lungs are adequately inflated. The interstitial markings are
increased in the mid and lower lung zones and there is confluent
alveolar opacity at both bases greater on the right than on the
left. The hemidiaphragms remain partially obscured but are better
demonstrated today. The cardiac silhouette remains enlarged. The
pulmonary vascularity remains engorged. An esophagogastric tube is
visible. The endotracheal tube if present appears to be positioned
somewhat superiorly lie at least 2 cm above the superior margin of
the clavicular heads and 12 cm above the carina. The esophagogastric
tube cannot be followed beyond the mid thorax.
IMPRESSION: Slight interval improvement in bilateral alveolar opacities may
reflect resolving pulmonary edema or pneumonia. Stable cardiomegaly
and central pulmonary vascular congestion.

High positioning of the endotracheal tube 12 cm above the carina.
Advancement by 4-5 cm is recommended.

## 2016-12-02 IMAGING — CR DG CHEST 1V
1 series · 1 of 1 positions shown · non-contrast
Comparison: none

CLINICAL DATA: Shortness of breath.

EXAM:
CHEST 1 VIEW

[portable]
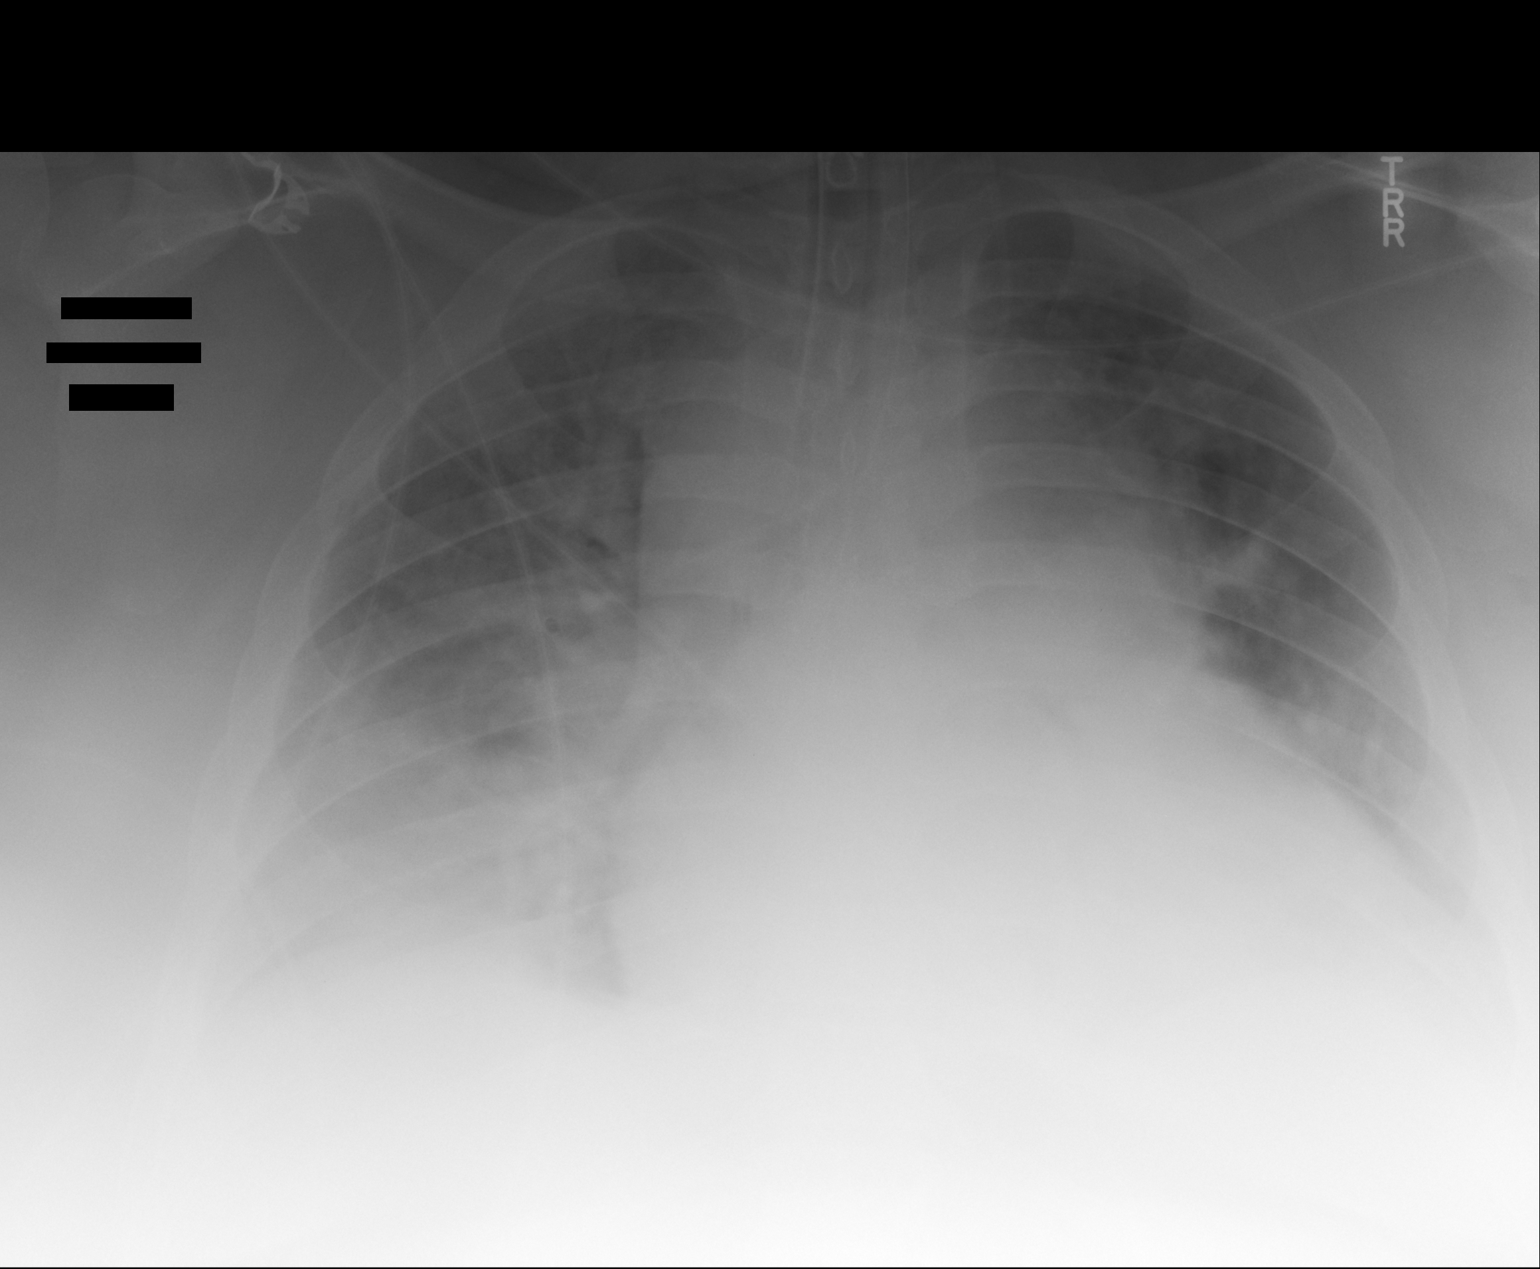

[1 of 1 positions shown; findings below may reference images not displayed]

FINDINGS: Endotracheal tube and left IJ line in stable position. Cardiomegaly
with persistent bilateral pulmonary infiltrates suggesting
congestive heart failure with persistent pulmonary edema. Bilateral
pneumonia cannot be excluded. Small pleural effusions cannot be
excluded. No pneumothorax.
IMPRESSION: 1. Lines and tubes in stable position.
2. Severe cardiomegaly with persistent bilateral pulmonary alveolar
infiltrates, right side greater left. Findings suggest congestive
heart failure. Bilateral pneumonia cannot be excluded. Small pleural
effusions cannot be excluded. No significant improvement from prior
exam.

## 2016-12-03 IMAGING — CR DG CHEST 1V PORT
2 series · 2 of 2 positions shown · non-contrast
Comparison: 10/29/2015

CLINICAL DATA: Acute respiratory failure.

EXAM:
PORTABLE CHEST 1 VIEW

[AP (1 of 2)]
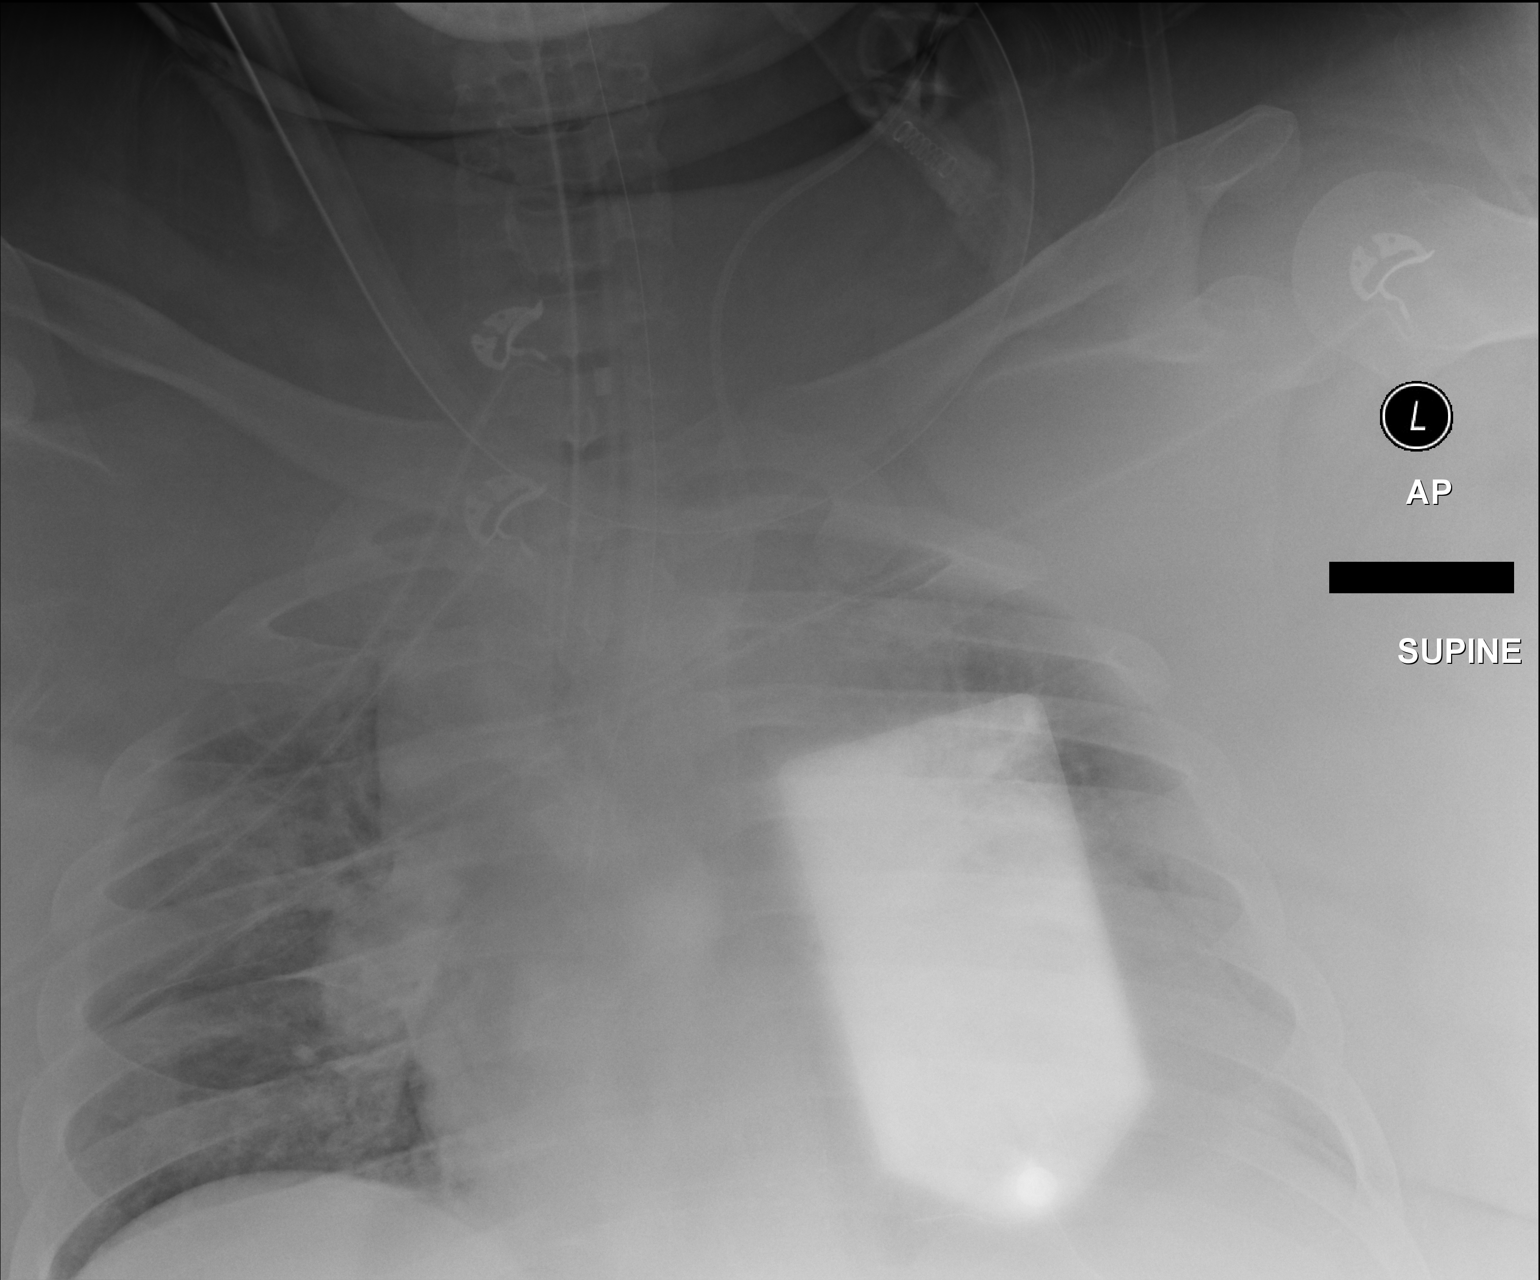

[AP (2 of 2)]
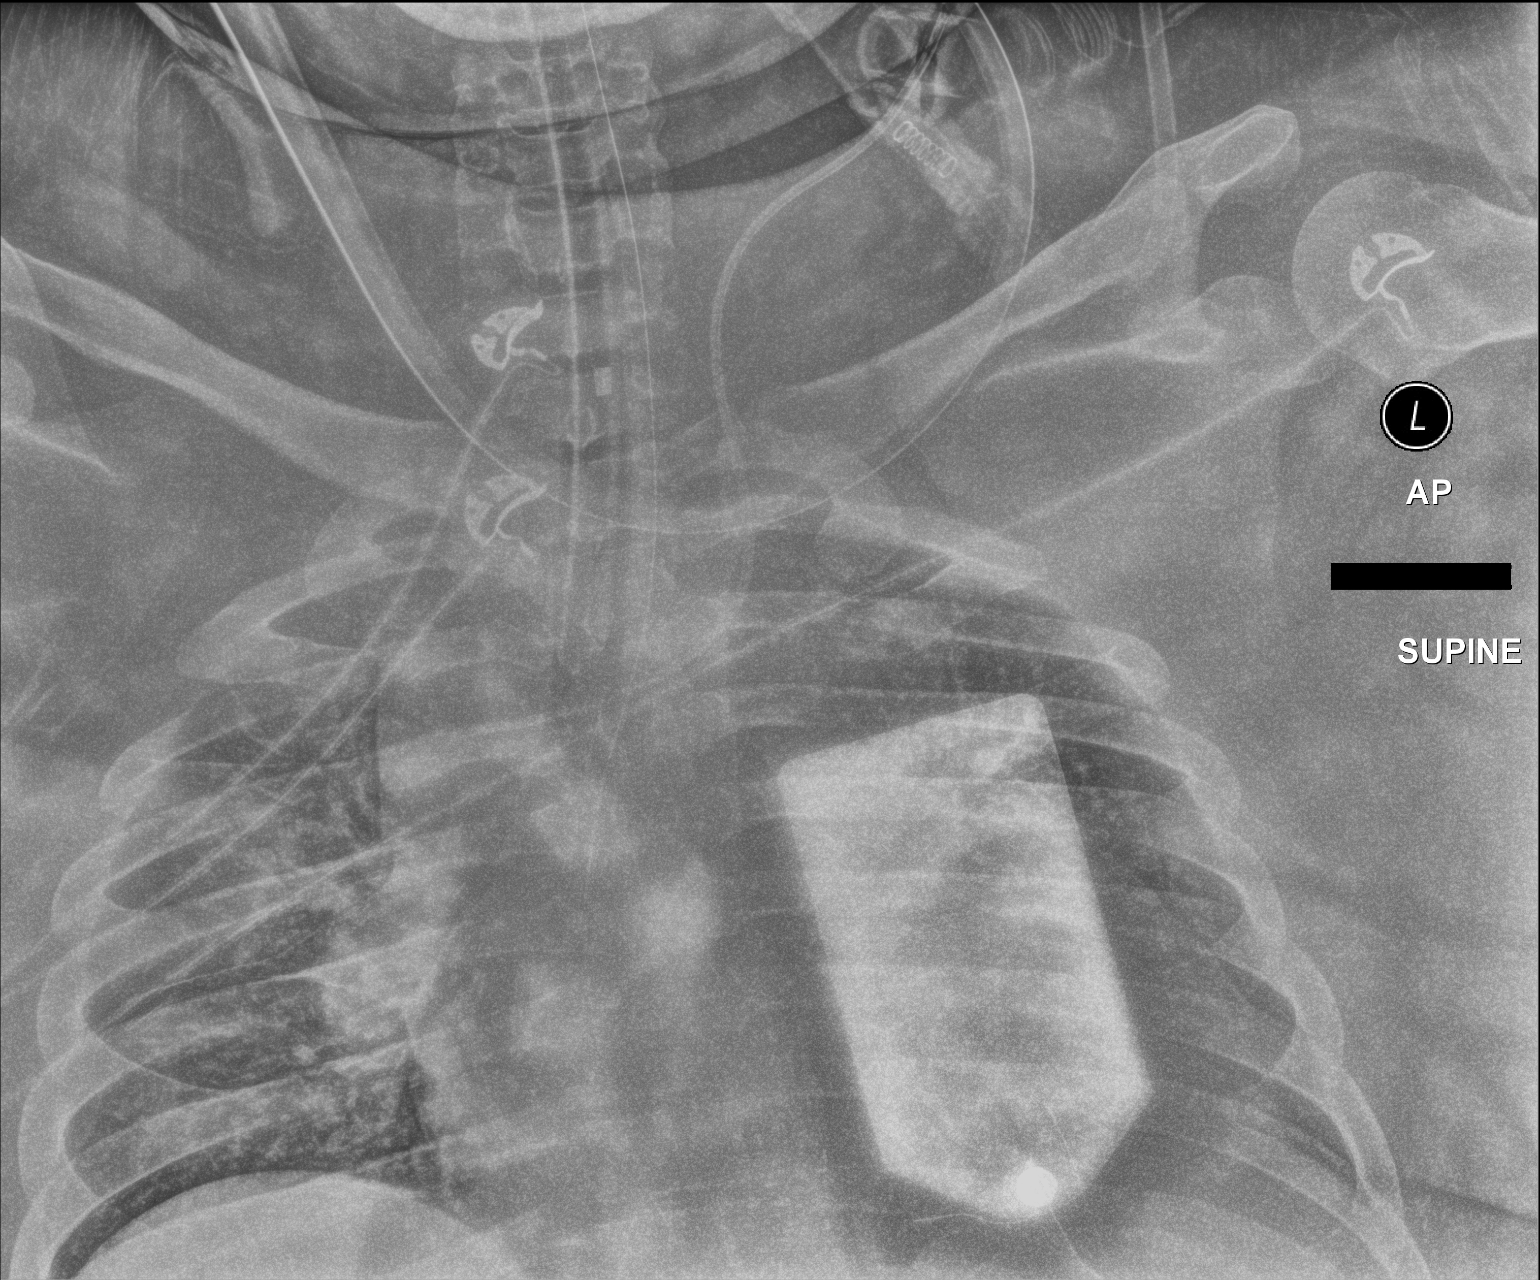

[2 of 2 positions shown; findings below may reference images not displayed]

FINDINGS: Endotracheal tube is 3 cm above the carina. Left jugular central
line extends to the SVC. Ground-glass opacities persist in the
central and basilar regions. No large pneumothorax. Study limited by
CPR board and superimposed pacing patch.
IMPRESSION: Support equipment appears satisfactorily positioned. No significant
interval change in the bilateral airspace opacities.
